# Patient Record
Sex: Male | Born: 1950 | Race: White | Hispanic: No | Marital: Married | Smoking: Former smoker
Health system: Southern US, Community
[De-identification: ages and names within clinical notes are randomized; demographics above are authoritative.]

## PROBLEM LIST (undated history)

## (undated) DIAGNOSIS — I10 Essential (primary) hypertension: Secondary | ICD-10-CM

## (undated) DIAGNOSIS — R112 Nausea with vomiting, unspecified: Secondary | ICD-10-CM

## (undated) DIAGNOSIS — H9190 Unspecified hearing loss, unspecified ear: Secondary | ICD-10-CM

## (undated) DIAGNOSIS — J069 Acute upper respiratory infection, unspecified: Secondary | ICD-10-CM

## (undated) DIAGNOSIS — N318 Other neuromuscular dysfunction of bladder: Secondary | ICD-10-CM

## (undated) DIAGNOSIS — M1990 Unspecified osteoarthritis, unspecified site: Secondary | ICD-10-CM

## (undated) DIAGNOSIS — K219 Gastro-esophageal reflux disease without esophagitis: Secondary | ICD-10-CM

## (undated) DIAGNOSIS — N200 Calculus of kidney: Secondary | ICD-10-CM

## (undated) DIAGNOSIS — Z973 Presence of spectacles and contact lenses: Secondary | ICD-10-CM

## (undated) DIAGNOSIS — R319 Hematuria, unspecified: Secondary | ICD-10-CM

## (undated) HISTORY — PX: PLANTAR FASCIA RELEASE: SHX2239

## (undated) HISTORY — DX: Essential (primary) hypertension: I10

## (undated) HISTORY — PX: TOTAL KNEE ARTHROPLASTY: SHX125

---

## 2002-04-22 ENCOUNTER — Encounter: Payer: Self-pay | Admitting: Family Medicine

## 2002-04-22 ENCOUNTER — Ambulatory Visit (HOSPITAL_COMMUNITY): Admission: RE | Admit: 2002-04-22 | Discharge: 2002-04-22 | Payer: Self-pay | Admitting: Family Medicine

## 2004-01-25 ENCOUNTER — Ambulatory Visit (HOSPITAL_COMMUNITY): Admission: RE | Admit: 2004-01-25 | Discharge: 2004-01-25 | Payer: Self-pay | Admitting: Family Medicine

## 2005-03-15 ENCOUNTER — Ambulatory Visit (HOSPITAL_COMMUNITY): Admission: RE | Admit: 2005-03-15 | Discharge: 2005-03-15 | Payer: Self-pay | Admitting: Family Medicine

## 2005-11-28 ENCOUNTER — Encounter: Admission: RE | Admit: 2005-11-28 | Discharge: 2005-11-28 | Payer: Self-pay | Admitting: Family Medicine

## 2006-05-14 ENCOUNTER — Ambulatory Visit (HOSPITAL_COMMUNITY): Admission: RE | Admit: 2006-05-14 | Discharge: 2006-05-14 | Payer: Self-pay | Admitting: Family Medicine

## 2006-06-14 ENCOUNTER — Ambulatory Visit: Payer: Self-pay | Admitting: Gastroenterology

## 2006-06-26 ENCOUNTER — Ambulatory Visit: Payer: Self-pay | Admitting: Gastroenterology

## 2006-06-26 ENCOUNTER — Encounter (INDEPENDENT_AMBULATORY_CARE_PROVIDER_SITE_OTHER): Payer: Self-pay | Admitting: Specialist

## 2006-06-26 HISTORY — PX: COLONOSCOPY: SHX174

## 2007-02-17 ENCOUNTER — Inpatient Hospital Stay (HOSPITAL_COMMUNITY): Admission: RE | Admit: 2007-02-17 | Discharge: 2007-02-19 | Payer: Self-pay | Admitting: Orthopedic Surgery

## 2007-12-10 ENCOUNTER — Ambulatory Visit (HOSPITAL_COMMUNITY): Admission: RE | Admit: 2007-12-10 | Discharge: 2007-12-10 | Payer: Self-pay | Admitting: Orthopedic Surgery

## 2007-12-30 ENCOUNTER — Inpatient Hospital Stay (HOSPITAL_COMMUNITY): Admission: RE | Admit: 2007-12-30 | Discharge: 2008-01-05 | Payer: Self-pay | Admitting: Orthopedic Surgery

## 2007-12-30 ENCOUNTER — Encounter (INDEPENDENT_AMBULATORY_CARE_PROVIDER_SITE_OTHER): Payer: Self-pay | Admitting: Orthopedic Surgery

## 2008-09-08 ENCOUNTER — Ambulatory Visit (HOSPITAL_BASED_OUTPATIENT_CLINIC_OR_DEPARTMENT_OTHER): Admission: RE | Admit: 2008-09-08 | Discharge: 2008-09-08 | Payer: Self-pay | Admitting: Orthopedic Surgery

## 2008-11-10 ENCOUNTER — Ambulatory Visit (HOSPITAL_COMMUNITY): Admission: RE | Admit: 2008-11-10 | Discharge: 2008-11-10 | Payer: Self-pay | Admitting: Family Medicine

## 2008-11-16 ENCOUNTER — Ambulatory Visit (HOSPITAL_COMMUNITY): Admission: RE | Admit: 2008-11-16 | Discharge: 2008-11-16 | Payer: Self-pay | Admitting: Family Medicine

## 2009-02-04 ENCOUNTER — Encounter (INDEPENDENT_AMBULATORY_CARE_PROVIDER_SITE_OTHER): Payer: Self-pay | Admitting: Orthopedic Surgery

## 2009-02-04 ENCOUNTER — Inpatient Hospital Stay (HOSPITAL_COMMUNITY): Admission: RE | Admit: 2009-02-04 | Discharge: 2009-02-08 | Payer: Self-pay | Admitting: Orthopedic Surgery

## 2009-07-19 ENCOUNTER — Ambulatory Visit (HOSPITAL_COMMUNITY): Admission: RE | Admit: 2009-07-19 | Discharge: 2009-07-19 | Payer: Self-pay | Admitting: Family Medicine

## 2010-06-14 LAB — COMPREHENSIVE METABOLIC PANEL
ALT: 18 U/L (ref 0–53)
AST: 16 U/L (ref 0–37)
Albumin: 3.5 g/dL (ref 3.5–5.2)
Alkaline Phosphatase: 44 U/L (ref 39–117)
BUN: 22 mg/dL (ref 6–23)
CO2: 27 mEq/L (ref 19–32)
Calcium: 8.8 mg/dL (ref 8.4–10.5)
Chloride: 106 mEq/L (ref 96–112)
Creatinine, Ser: 0.88 mg/dL (ref 0.4–1.5)
GFR calc Af Amer: >60 mL/min (ref >60)
GFR calc non Af Amer: >60 mL/min (ref >60)
Glucose, Bld: 137 mg/dL — ABNORMAL HIGH (ref 70–99)
Potassium: 4.2 mEq/L (ref 3.5–5.1)
Sodium: 139 mEq/L (ref 135–145)
Total Bilirubin: 0.9 mg/dL (ref 0.3–1.2)
Total Protein: 6.1 g/dL (ref 6.0–8.3)

## 2010-06-14 LAB — WOUND CULTURE
Culture: NO GROWTH
Gram Stain: NONE SEEN

## 2010-06-14 LAB — CBC
HCT: 31.8 % — ABNORMAL LOW (ref 39.0–52.0)
HCT: 32.3 % — ABNORMAL LOW (ref 39.0–52.0)
HCT: 34.2 % — ABNORMAL LOW (ref 39.0–52.0)
HCT: 35 % — ABNORMAL LOW (ref 39.0–52.0)
Hemoglobin: 11.1 g/dL — ABNORMAL LOW (ref 13.0–17.0)
Hemoglobin: 11.2 g/dL — ABNORMAL LOW (ref 13.0–17.0)
Hemoglobin: 11.7 g/dL — ABNORMAL LOW (ref 13.0–17.0)
Hemoglobin: 12 g/dL — ABNORMAL LOW (ref 13.0–17.0)
MCHC: 34.3 g/dL (ref 30.0–36.0)
MCHC: 34.4 g/dL (ref 30.0–36.0)
MCHC: 34.7 g/dL (ref 30.0–36.0)
MCHC: 35 g/dL (ref 30.0–36.0)
MCV: 100.4 fL — ABNORMAL HIGH (ref 78.0–100.0)
MCV: 100.4 fL — ABNORMAL HIGH (ref 78.0–100.0)
MCV: 100.9 fL — ABNORMAL HIGH (ref 78.0–100.0)
MCV: 101.6 fL — ABNORMAL HIGH (ref 78.0–100.0)
Platelets: 190 10*3/uL (ref 150–400)
Platelets: 192 10*3/uL (ref 150–400)
Platelets: 204 10*3/uL (ref 150–400)
Platelets: 236 10*3/uL (ref 150–400)
RBC: 3.17 MIL/uL — ABNORMAL LOW (ref 4.22–5.81)
RBC: 3.22 MIL/uL — ABNORMAL LOW (ref 4.22–5.81)
RBC: 3.37 MIL/uL — ABNORMAL LOW (ref 4.22–5.81)
RBC: 3.47 MIL/uL — ABNORMAL LOW (ref 4.22–5.81)
RDW: 11.8 % (ref 11.5–15.5)
RDW: 11.9 % (ref 11.5–15.5)
RDW: 11.9 % (ref 11.5–15.5)
RDW: 12.2 % (ref 11.5–15.5)
WBC: 4 10*3/uL (ref 4.0–10.5)
WBC: 4.8 10*3/uL (ref 4.0–10.5)
WBC: 5.2 10*3/uL (ref 4.0–10.5)
WBC: 5.4 10*3/uL (ref 4.0–10.5)

## 2010-06-14 LAB — BASIC METABOLIC PANEL
BUN: 8 mg/dL (ref 6–23)
BUN: 8 mg/dL (ref 6–23)
CO2: 31 mEq/L (ref 19–32)
CO2: 32 mEq/L (ref 19–32)
Calcium: 8.5 mg/dL (ref 8.4–10.5)
Calcium: 8.7 mg/dL (ref 8.4–10.5)
Chloride: 100 mEq/L (ref 96–112)
Chloride: 102 mEq/L (ref 96–112)
Creatinine, Ser: 0.9 mg/dL (ref 0.4–1.5)
Creatinine, Ser: 1.13 mg/dL (ref 0.4–1.5)
GFR calc Af Amer: >60 mL/min (ref >60)
GFR calc Af Amer: >60 mL/min (ref >60)
GFR calc non Af Amer: >60 mL/min (ref >60)
GFR calc non Af Amer: >60 mL/min (ref >60)
Glucose, Bld: 115 mg/dL — ABNORMAL HIGH (ref 70–99)
Glucose, Bld: 96 mg/dL (ref 70–99)
Potassium: 4.2 mEq/L (ref 3.5–5.1)
Potassium: 4.4 mEq/L (ref 3.5–5.1)
Sodium: 136 mEq/L (ref 135–145)
Sodium: 138 mEq/L (ref 135–145)

## 2010-06-14 LAB — GRAM STAIN: Gram Stain: NONE SEEN

## 2010-06-14 LAB — PROTIME-INR
INR: 1.09 (ref 0.00–1.49)
INR: 1.17 (ref 0.00–1.49)
INR: 1.41 (ref 0.00–1.49)
INR: 1.48 (ref 0.00–1.49)
Prothrombin Time: 14 seconds (ref 11.6–15.2)
Prothrombin Time: 14.8 seconds (ref 11.6–15.2)
Prothrombin Time: 17.1 seconds — ABNORMAL HIGH (ref 11.6–15.2)
Prothrombin Time: 17.8 seconds — ABNORMAL HIGH (ref 11.6–15.2)
Prothrombin Time: 19.8 seconds — ABNORMAL HIGH (ref 11.6–15.2)

## 2010-06-14 LAB — URINALYSIS, ROUTINE W REFLEX MICROSCOPIC
Bilirubin Urine: NEGATIVE
Glucose, UA: NEGATIVE mg/dL
Hgb urine dipstick: NEGATIVE
Ketones, ur: NEGATIVE mg/dL
Nitrite: NEGATIVE
Protein, ur: NEGATIVE mg/dL
Specific Gravity, Urine: 1.023 (ref 1.005–1.030)
Urobilinogen, UA: 0.2 mg/dL (ref 0.0–1.0)
pH: 6 (ref 5.0–8.0)

## 2010-06-14 LAB — TYPE AND SCREEN
ABO/RH(D): A POS
Antibody Screen: NEGATIVE

## 2010-06-14 LAB — APTT: aPTT: 35 seconds (ref 24–37)

## 2010-06-16 LAB — CREATININE, SERUM: GFR calc non Af Amer: >60 mL/min (ref >60)

## 2010-06-19 LAB — POCT I-STAT 4, (NA,K, GLUC, HGB,HCT)
Glucose, Bld: 94 mg/dL (ref 70–99)
HCT: 42 % (ref 39.0–52.0)
Hemoglobin: 14.3 g/dL (ref 13.0–17.0)
Sodium: 138 mEq/L (ref 135–145)

## 2010-07-25 NOTE — H&P (Signed)
Gerald Harding, Gerald Harding               ACCOUNT NO.:  [id number]   MEDICAL RECORD NO.:  [id number]          PATIENT TYPE:  INP   LOCATION:  NA                           FACILITY:  [facility]   PHYSICIAN:  Ollen Gross, M.D.    DATE OF BIRTH:  Jun 05, 1950   DATE OF ADMISSION:  12/30/2007  DATE OF DISCHARGE:                              HISTORY & PHYSICAL   CHIEF COMPLAINT:  Painful right total knee arthroplasty.   Gerald Harding is a 60 year old male with right knee pain and a right total  knee arthroplasty.  He has had it evaluated with x-rays, aspiration, and  a bone scan and findings are consistent with loosening with either  synovitis or infection.  The patient has elected to proceed with a  reevaluation and possible revision of his right total knee arthroplasty  by Dr. Lequita Halt on December 30, 2007.   PRIMARY CARE PHYSICIAN:  Dr. Regino Schultze in [place].   ALLERGIES:  No known drug allergies.   CURRENT MEDICATIONS:  Arthrotec, Amitiza, Diovan, multivitamins,  Prilosec OTC, and Vicodin.   PAST MEDICAL HISTORY:  1. Asthma as a child.  2. Hypertension.  3. Reflux.  4. Occasional constipation.  5. Stress test 2 years previous, reported normal.   REVIEW OF SYSTEMS:  Negative for any neurologic issues.  PULMONARY:  Issues are unremarkable.  Last asthma attack was as a young child.  CARDIOVASCULAR:  He had a stress test 2 years previous.  He denies any  chest pain, shortness of breath, irregular heart rhythms or any other  cardiac issues.  GI is positive for reflux which is well-controlled with  over-the-counter Prilosec.  He denies any other issues, GU is  unremarkable.  Hematologic and endocrine are unremarkable.   PAST SURGICAL HISTORY:  Includes a right knee arthroscopy, right total  knee arthroplasty in December 2008, and a fascial release on his heel  without any complications with anesthesia.   FAMILY MEDICAL HISTORY:  Father is deceased from lung cancer.  Mother is  alive and well,  R.N. over at [employer].   SOCIAL HISTORY:  The patient is married.  He owns [employer].  He  is currently working.  He denies any smoking.  Occasional alcoholic  beverage.  He lives in a [place] home.   PHYSICAL EXAM:  VITALS:  Height is 5 feet 11 inches, weight is 195  pounds, blood pressure is 138/82, pulse of 70 and regular, respirations  12, patient is afebrile.  GENERAL:  This is a healthy-appearing gentleman, conscious, alert and  appropriate.  Walks very easy balanced gait.  HEENT:  Head was normocephalic.  Pupils equal, round, reactive.  Gross  hearing is intact.  NECK:  Supple.  Good range of motion.  CHEST:  Lung sounds were clear and equal bilaterally.  No wheezes,  rales, rhonchi.  HEART:  Regular rate and rhythm.  ABDOMEN:  Soft.  Bowel sounds present.  EXTREMITIES:  Upper extremities had good range of motion of shoulders,  elbows, wrists.  Good motor strength.  Lower Extremities:  Both hips had  full extension and flexion up to 130,  30 degrees internal-external  rotation without any discomfort.  Right knee had a well-healed midline  surgical incision.  He had a little soft tissue swelling.  He was sore  along the femoral condyle region.  He was able to fully extend it.  He  can flex it back to 130 degrees.  He had no gross instability.  Calf was  soft, nontender.  Left knee had full extension.  He was able to flex it  back 120.  He had no gross instability.  Both ankles had good range of  motion.  PERIPHERAL VASCULAR:  Carotid pulses were 2+ bruits.  Radial pulses 2+  and posterior tibial pulses were 1+.  He had no pigmentation changes in  the lower extremities.  NEURO:  The patient was conscious, alert, appropriate.  No gross  neurologic changes.  Breast, rectal and GU exams were deferred at this time.   IMPRESSION:  1. Right total knee arthroplasty 1 year previous with possible      loosening versus synovitis versus infection.  2. Hypertension.  3. Reflux  disease.  4. Occasional constipation.  5. Childhood asthma.  6. Stress test 2 years previous, reported to be normal.   PLAN:  The patient will undergo all routine labs and tests prior to  having a reevaluation and possible revision of his right total knee  arthroplasty by Dr. Lequita Halt at [facility] on December 30, 2007.      Jamelle Rushing, P.A.      Ollen Gross, M.D.  Electronically Signed    RWK/MEDQ  D:  12/29/2007  T:  12/29/2007  Job:  [id number]

## 2010-07-25 NOTE — Op Note (Signed)
NAMEKEMARION, ABBEY               ACCOUNT NO.:  [id number]   MEDICAL RECORD NO.:  [id number]          PATIENT TYPE:  INP   LOCATION:  2550                         FACILITY:  [facility]   PHYSICIAN:  Robert A. Thurston Hole, M.D. DATE OF BIRTH:  08/06/1950   DATE OF PROCEDURE:  02/17/2007  DATE OF DISCHARGE:                               OPERATIVE REPORT   PREOPERATIVE DIAGNOSIS:  Right knee degenerative joint disease.   POSTOPERATIVE DIAGNOSIS:  Right knee degenerative joint disease.   PROCEDURE:  Right total knee replacement using DePuy cemented total knee  system with #3 cemented femur, #4 cemented tibia, with 10-mm  polyethylene RP tibial spacer and 32-mm polyethylene cemented patella.   SURGEON:  Elana Alm. Thurston Hole, M.D.   ASSISTANT:  Julien Girt, P.A.   ANESTHESIA:  General.   OPERATIVE TIME:  1 hour 20 minutes.   COMPLICATIONS:  None.   DESCRIPTION OF PROCEDURE:  Mr. Blossom was brought to the operating room  on February 17, 2007 after a femoral nerve block was placed in the  holding room by anesthesia.  He was placed on the operating table in the  supine position none.  He received Ancef 1 gram IV preoperatively for  prophylaxis.  After being placed under general anesthesia, he had a  Foley catheter placed under sterile conditions.   His right knee was examined under anesthesia.  Range of motion 0-125  degrees with significant patellofemoral crepitation.  Overall alignment  is satisfactory.  The right leg was prepped using sterile DuraPrep and  draped using sterile technique.  The leg was exsanguinated and a thigh  tourniquet elevated to 365 mm.   Initially through a 12-cm longitudinal incision based over the patella,  initial exposure was made.  The underlying subcutaneous tissues were  incised along with the skin incision.  A median arthrotomy was performed  revealing an excessive amount of normal-appearing joint fluid.  The  articular surfaces were inspected.  He had  grade 3 changes medially and  laterally and grade 4 changes in the patellofemoral joint.  Osteophytes  were removed from the femoral condyles and tibial plateau.  The medial  and lateral meniscal remnants were removed as well as the anterior  cruciate ligament.  An intramedullary drill was then drilled up the  femoral canal for placement of the distal femoral cutting jig which was  placed in the appropriate amount of rotation, and a distal 11-mm cut was  made.  The distal femur was incised.  A #3 was found be the appropriate  size.  The #3 cutting jig was placed in the appropriate amount of  external rotation, and these cuts were made.  The proximal tibia was  then exposed.  The tibial spines were removed with an oscillating saw.  Intramedullary drill was drilled down the tibial canal for placement of  the proximal tibial cutting jig which was placed in the appropriate  amount of rotation, and a 6-mm proximal cut was made.  Spacer blocks  were then placed in flexion and extension.  The 10-mm blocks gave  excellent balancing, excellent stability, and overall alignment  excellent.  At this point, the #4 tibial base plate trial was placed on  the cut tibial surface, and this gave an excellent fit and a keel cut  was made.  The PCL #3 box cutter was then placed on the distal femur,  and these cuts were made.  At this point, the #3 femoral trial was  placed, #4 tibial base plate trial with a 10-mm polyethylene RP tibial  spacer.  The knee was reduced and taken through a range of motion from 0-  125 degrees, with excellent stability and overall alignment  satisfactory.  At this point, the patella was sized.  A resurfacing 8-mm  cut was made and 3 locking holes placed for a 32-mm patella.  The  patellar trial was placed.  Patellofemoral tracking was evaluated and  found to be normal.  At this point, it was felt that all of the trial  components were of excellent size, fit, and stability.  They  were then  removed.  The knee was then jet lavage irrigated with 3 liters of  saline.  The proximal tibia was then exposed.  A #4 tibial baseplate  with cement backing was hammered into position with an excellent fit,  with excess cement being removed from around the edges.  A #3 femoral  component with cement backing was hammered into position also with an  excellent fit, with excess cement being removed from around the edges.  The 10-mm polyethylene RP tibial spacer was placed on the tibial  baseplate and the knee reduced, taken through a range of motion from 0-  125 degrees, with excellent stability and excellent overall alignment.  The #32-mm polyethylene cemented patella was then placed in its position  and held there with a clamp.  After the cement hardened, patellofemoral  tracking was again evaluated and found to be normal.  At this point, it  was felt that all of the components were of excellent size, fit, and  stability.  The wound was further irrigated.  The tourniquet was  released.  Hemostasis was obtained with cautery.  The arthrotomy was  then closed with #1 Ethibond suture over 2 medium Hemovac drains.  The  subcutaneous tissues were closed with 0 and 2-0 Vicryl and the  subcuticular layer closed with 4-0 Monocryl.  Sterile dressings and a  long-leg splint applied.   The patient was awakened and taken to the recovery room in a stable  condition.  Needle, sponge, and instrument counts were correct x2 at the  end of the case.      Robert A. Thurston Hole, M.D.  Electronically Signed     RAW/MEDQ  D:  02/17/2007  T:  02/17/2007  Job:  [id number]

## 2010-07-25 NOTE — Op Note (Signed)
Gerald Harding, Gerald Harding               ACCOUNT NO.:  [id number]   MEDICAL RECORD NO.:  [id number]          PATIENT TYPE:  INP   LOCATION:  1604                         FACILITY:  [facility]   PHYSICIAN:  Ollen Gross, M.D.    DATE OF BIRTH:  1950-06-17   DATE OF PROCEDURE:  12/30/2007  DATE OF DISCHARGE:                               OPERATIVE REPORT   PREOPERATIVE DIAGNOSIS:  Failed right total knee arthroplasty.   POSTOPERATIVE DIAGNOSIS:  Failed right total knee arthroplasty.   PROCEDURE:  Right total knee arthroplasty revision.   SURGEON:  Ollen Gross, M.D.   ASSISTANT:  Alexzandrew L. Julien Girt, P.A.C.  and Dr. Thurston Hole.   ANESTHESIA:  General, postop Marcaine pain pump.   ESTIMATED BLOOD LOSS:  300.   DRAINS:  Hemovac times one.   TOURNIQUET TIME:  Up 57 minutes at 300 mmHg, down 8 minutes and up an  additional 37 minutes at 300 mmHg.   COMPLICATIONS:  None.   CONDITION:  Stable to recovery.   CLINICAL NOTE:  Gerald Harding is a 60 year old male with severe right knee  pain.  He had an uncomplicated right total knee arthroplasty done almost  a year ago.  He has had recurrent effusions as well as marked pain in  the knee.  He has had a few aspirations which showed no signs of  infection.  Given his marked pain and recurrent effusions we obtained a  three phase bone scan which showed increased activity on each phase  consistent with either loosening, significant synovitis or infection.  He presents now for right total knee arthroplasty revision versus  resection arthroplasty.   PROCEDURE IN DETAIL:  After the successful administration of general  anesthetic a tourniquet was placed high on the right thigh and right  lower extremity prepped and draped in the usual sterile fashion.  Extremity was wrapped in Esmarch, knee flexed, tourniquet inflated to  300 mmHg.  Midline incision made with a 10 blade through subcutaneous  tissue to the level of the extensor mechanism.  A fresh  blade was used  to make a medial parapatellar arthrotomy.  There is a fair amount of  clear fluid encountered and that was sent for stat Gram stain which was  negative.  The soft tissue of the proximal medial tibia was then  subperiosteally elevated with the knife into the semimembranosus bursa  with a Cobb elevator.  Soft tissue laterally was elevated with attention  being paid to avoid the patellar tendon on tibial tubercle.  Patella was  everted and knee flexed 90 degrees.  We inspected the joint, did not see  any damage to the polyethylene.  I sent a specimen of synovium and that  was shown to have no evidence of any acute increased inflammation.  We  then subsequently sent two more specimens from other areas of the knee,  also which did not show any acute inflammation.   We removed the tibial polyethylene from the tibial tray.  The posterior  tissue did not have any evidence of significant inflammation or  irritation.  We then removed the femoral component  by disrupting the  interface between the metal and the femur with osteotomes.  It was not  grossly loose but came off with little effort.  There was minimal bone  loss on the component.  On the tibial side we subsequently subluxed the  tibia forward and I used an oscillating saw to disrupt the interface  between the tibial component and bone.  Tibial component was also  removed with minimal bone loss.   The cut bone surfaces were then inspected and any cement was removed.  The cement from the tibial canal was also removed.  Both canals were  thoroughly irrigated with saline solution and on the tibial side we  reamed up to 13 mm and on the femoral side reamed up to 18 mm.   We then used the extramedullary cutting guide on the tibia to perform  the tibial resection.  We referenced proximally at the medial aspect of  the tibial tubercle and distally along the second metatarsal axis of  tibial crest.  I removed about a mm or 2 from  the cut bone surface of  the tibia.  Size 4 was the most appropriate tibial component and the  proximal tibia was prepared with the modular drill and a keel punch for  the size 4.  On the femoral side I placed the 18 mm reamer and used that  as our intramedullary guide.  The 5 degree right valgus alignment guide  is placed and I took 4 mm off the distal femur and replaced with 4 mm  augments medially and laterally.  Size 4 is the most appropriate femoral  component.  The AP block is placed in a +2 position, rotation is  determined by placing a 12.5 mm spacer at 90 degrees of flexion to get a  rectangular flexion gap.  The anterior and posterior cuts are made.  I  went to a +4 position posteriorly for the 4 mm posterior augment.  The  intercondylar chamfer blocks were placed and those cuts were  subsequently made.   The trial MBT revision tibia size 4 with a 13 x 30 stem extension is  placed and then on the femoral side the TC3 size 4 femur with an 18 x 75  stem extension and 4 mm medial lateral posterior and distal augments.  The components had excellent fit on the cut bony surfaces.  A 12.5 mm  insert was placed which had outstanding stability with full extension  and excellent stability of varus-valgus anterior-posterior stressing all  the way through the full range of motion.  The patella was in good  condition and we left the patella intact.  I then performed a thorough  synovectomy throughout the entire joint.  We subsequently let the  tourniquet down for an initial tourniquet time of 57 minutes.  The joint  was inspected and mild bleeding was stopped with electrocautery.  The  tourniquet was held down for 8 minutes while the components were  assembled on the back table.   Once the components were assembled the tourniquet was down for 8 minutes  and rewrapped the leg in Esmarch and inflated again to 300 mmHg.  The  trials were removed and cement restrictor, a trial was placed and  size 4  most appropriate.  Size 4 cement restrictor is placed at the appropriate  depth in the tibial canal.  We then thoroughly prepared the bone with  pulsatile lavage.  The cement was mixed.  Once ready for implantation  it  is injected into the tibial canal and then the tibial component was  impacted and extruded cement removed.  On the femoral side we Press-Fit  the stem and cemented distally.  All extruded cement was removed.  Please note that 3 grams of vancomycin were mixed with the 3 batches of  cement.  The trial 12.5 mm insert was placed and knee held in full  extension and all extruded cement removed.  The cement was fully  hardened and the permanent 12.5 mm TC3 rotating platform insert is  placed into the tibial tray.  This gave [place] outstanding stability  throughout full range of motion.  Wound was copiously irrigated with  saline solution and the arthrotomy closed over Hemovac drain with  interrupted #1 PDS.  Flexion against gravity was 135 degrees.  Subcu was  closed with interrupted 2-0 Vicryl subcu  and skin closed with staples.  The catheter for the Marcaine pain pump  is placed and the pump was initiated.  A bulky sterile dressing was  applied and he was placed into a knee immobilizer, awakened and  transported to recovery in stable condition.      Ollen Gross, M.D.  Electronically Signed     FA/MEDQ  D:  12/30/2007  T:  12/31/2007  Job:  [id number]

## 2010-07-25 NOTE — Discharge Summary (Signed)
Gerald Harding, Gerald Harding               ACCOUNT NO.:  [id number]   MEDICAL RECORD NO.:  [id number]          PATIENT TYPE:  INP   LOCATION:  1604                         FACILITY:  [facility]   PHYSICIAN:  Ollen Gross, M.D.    DATE OF BIRTH:  06/29/1950   DATE OF ADMISSION:  12/30/2007  DATE OF DISCHARGE:  01/05/2008                               DISCHARGE SUMMARY   ADDENDUM DISCHARGE SUMMARY   ADMISSION DIAGNOSES:  Same.   DISCHARGE DIAGNOSES:  Same.   PROCEDURE:  Same.   ADDITION TO LABORATORY DATA:  Serial pro times were followed.  The last-  noted INR was 114 at the time of the original dictation.  INR was 1.5  prior to discharge, slowly titrating up.   HOSPITAL COURSE:  The patient was originally scheduled to possibly leave  the hospital on Friday, 10/23.  We were looking for a short-term rehab  bed.  Fortunately, no beds were available, so he remained in the  hospital through the weekend.  He did extremely well with therapy,  walking about 200 feet.  The incision continued to heal well.  No signs  of infection.  Progressed very well, so home arrangements were made.   He was seen on rounds on the morning of October 26 with no complaints,  tolerating his meds, ambulating well, and was discharged home that day.   DISCHARGE PLAN:  1. Discharged home on January 05, 2008.  2. Discharge diagnoses:  Please see above.  3. Discharge meds:  Percocet, Robaxin, Coumadin, and Lovenox for 2      more days at home.  4. Diet:  Heart-healthy diet.  5. Activity:  Weightbearing as tolerated to the right lower extremity.      Home health PT, home health nursing, total knee protocol.  Gait-      training ambulation, ADLs.  6. Followup:  He is going to follow up on Friday, January 09, 2008,      with Dr. Despina Hick.  Please contact the office for an appointment at      [phone].   DISPOSITION:  Home.   CONDITION ON DISCHARGE:  Improving.      Gerald Harding, P.A.C.      Ollen Gross, M.D.  Electronically Signed    ALP/MEDQ  D:  01/05/2008  T:  01/05/2008  Job:  [id number]   cc:   Kirk Ruths, M.D.  Fax: [phone]   Elana Alm. Thurston Hole, M.D.  Fax: [phone]

## 2010-07-25 NOTE — Discharge Summary (Signed)
Gerald Harding, Gerald Harding               ACCOUNT NO.:  [id number]   MEDICAL RECORD NO.:  [id number]          PATIENT TYPE:  INP   LOCATION:  1604                         FACILITY:  [facility]   PHYSICIAN:  Ollen Gross, M.D.    DATE OF BIRTH:  [birth year]   DATE OF ADMISSION:  12/30/2007  DATE OF DISCHARGE:                               DISCHARGE SUMMARY   PRIMARY CARE PHYSICIAN:  Kirk Ruths, M.D.   ADMISSION DIAGNOSES:  1. Right knee pain, possible loosening, possible synovitis, possible      infection, status post right total knee.  2. Hypertension.  3. Reflux disease.  4. Occasional constipation.  5. Childhood asthma.   DISCHARGE DIAGNOSES:  1. Failed right total knee arthroplasty, status post right total knee      arthroplasty revision.  2. Mild postoperative blood loss anemia.  3. Mild postoperative hyponatremia.  4. Hypertension.  5. Reflux disease.  6. Occasional constipation.  7. Childhood asthma.   PROCEDURE:  December 30, 2007:  Right total knee arthroplasty revision.  Surgeon:  Ollen Gross, M.D., Assistant:  Elana Alm. Thurston Hole, M.D.,  Second assistant:  Avel Peace, PA-C.  Anesthesia:  General with a  postoperative Marcaine pain pump.   CONSULTATIONS:  None.   BRIEF HISTORY:  Gerald Harding is a 60 year old male with severe right knee  pain, an uncomplicated right total knee arthroplasty about one year ago,  although he started having recurrent effusions postoperatively with  increasing pain. He has had multiple work-ups, a few aspirations with no  signs of infection, although a three phase bone scan did show increased  activity in each phase consistent with possible loosening, synovitis or  infection.  Subsequently he was admitted to the hospital for revision  arthroplasty.   LABORATORY DATA:  CBC preoperative hemoglobin 13.2, hematocrit 38.2,  white cell count 5.0, platelet count 223,000.  Chem panel all within  normal limits.  PT/INR preoperative 13.5/1.0 with  PTT of 29.  Preoperative UA was negative.  Serial CBC's were followed and hemoglobin  dropped to 10.1 and then 9.6 with last noted H/H at time of dictation  9.8 and 27.8, stable.  Serial Pro Times were followed and last noted  PT/INR 18.1 and 1.4.  BMET's postoperatively:  Sodium did drop down to  135, from a preoperative level of 138, stabilized at 134.  Remaining  electrolytes remained within normal limits.   Pathology report, final diagnosis:  Synovium right knee, fibroconnective  tissue, stroma and fibrinous exudate.  No increase in neutrophils  identified.  There were three specimens sent to pathology.  First one:  No increase in neutrophils.  Second one:  No increase in neutrophils.  Third one:  No increase in neutrophils.   X-rays, two view chest, December 29, 2007:  No active disease.   EKG February 12, 2007:  Normal sinus rhythm  Normal EKG.  No previous  traces.  Confirmed by Dr. Golden Bing.   HOSPITAL COURSE:  The patient was admitted to [facility],  tolerated the procedure well and later was transferred to the recovery  room and then the  orthopedic floor.  Patient was started on PCA and p.o.  analgesics for pain control following surgery.  Patient was started on  Coumadin protocol for DVT prophylaxis.  Was given 24 hours postoperative  IV antibiotics.  Did pretty well on the evening of surgery and into the  morning of day #1.  Was weaning over off of his PCA over to p.o.  medications.  On the morning rounds of day #1 the patient states he can  already tell a difference, sitting up in the chair doing extremely well.  He still had a little bit of drainage from the Hemovac drain which was  placed at the time of surgery and so we left that in on morning rounds.  Blood pressure was a little low and continued on fluids.  Patient was  started back on his Prilosec for his reflux.  He started getting up out  of bed on day #1.  By day #2 he was doing extremely well with  very  little swelling when we changed the dressing.  The incision looked  excellent.  His hemoglobin was 9.6 and he was asymptomatic with this.  Blood pressure was stable.  He had cultures and STAT Gram stains taken  at the time of surgery.  The STAT Gram stains showed no organisms, no  WBC's and the wound cultures, anaerobic cultures were preliminary but  showing no growth at the time of dictation.  He had been weaned over to  p.o. medications, we discontinued the PCA and the IV fluids and  continued to ambulate well.  It was felt that the patient would require  a short stay in a rehab facility.  We got discharge planning involved.  He was seen on morning rounds on postoperative day #3.  He was doing  extremely well.  Unfortunately, the opposite leg/opposite knee started  flaring up.  The patient was concerned that he had a gout flare in this  knee.  Apparently the same thing happened to him about a year ago when  he was in the hospital.  About three to four days postoperative he had  another flare of the knee.   PROCEDURE:  Bedside procedure performed by Dr. Lequita Halt.  The patient  underwent local aspiration of about 50 mL of normal appearing synovial  fluid followed by a cortisone injection of Depo-Medrol and Lidocaine.  The patient tolerated this well.   He continued with therapy.  There was a possibility that a bed at a  skilled rehab facility would be available later that day and  arrangements were being made and if everything was lined up, he would be  able to be discharged on January 02, 2008.   DISCHARGE PLAN:  Tentative discharge today, January 02, 2008.   DISCHARGE DIAGNOSES:  Please see above.   DISCHARGE MEDICATIONS:  1. He is on Coumadin protocol.  Please titrate the Coumadin level for      a target INR between 2.0 and 3.0.  He will need to be on Coumadin      for three weeks from the date of surgery of December 30, 2007.  2. Colace 100 mg p.o. b.i.d.  3. Diovan 160 mg  p.o. q.a.m., hold for systolic pressure less than      130.  4. Amitiza 24 mcg capsules p.o. daily.  5. Prilosec 20 mg p.o. daily.  6. Nu-Iron 150 mg p.o. daily x3 weeks and then discontinue the iron.  7. Percocet 5 mg one or two every  4 hours as needed for pain.  8. Tylenol 325 mg one or two every 4 to 6 hours as needed for mild      pain, temperature or headache.  9. Robaxin 500 mg p.o. q.6h. p.r.n. spasm.  10.Ambien 10 mg p.o. nightly p.r.n. sleep.   DIET:  Heart healthy diet.   ACTIVITY:  He is weight-bearing as tolerated to the right lower  extremity, total knee protocol.  Physical therapy and occupational  therapy for gait training, ambulation, activities of daily living, range  of motion exercises.   WOUND CARE:  Daily dressing changes.  He may start showering, however,  do not submerge the incision under water.   FOLLOWUP:  He needs to follow up with Dr. Lequita Halt in the office in  approximately two weeks from the date of surgery.  Please contact the  office at [phone] to help arrange appointment for followup of this  patient.   DISPOSITION:  Pending at the time of this dictation.  Awaiting final bed  offers.   CONDITION ON DISCHARGE:  Pending, will discharge if bed is available and  improved.      Alexzandrew L. Perkins, P.A.C.      Ollen Gross, M.D.  Electronically Signed    ALP/MEDQ  D:  01/02/2008  T:  01/02/2008  Job:  [id number]   cc:   Kirk Ruths, M.D.  Fax: [phone]   Elana Alm. Thurston Hole, M.D.  Fax: [phone]

## 2010-07-25 NOTE — Op Note (Signed)
NAMEBENFORD, Gerald Harding               ACCOUNT NO.:  [id number]   MEDICAL RECORD NO.:  [id number]          PATIENT TYPE:  AMB   LOCATION:  [facility]                         FACILITY:  [facility]   PHYSICIAN:  Ollen Gross, M.D.    DATE OF BIRTH:  05-10-1950   DATE OF PROCEDURE:  09/08/2008  DATE OF DISCHARGE:                               OPERATIVE REPORT   PREOPERATIVE DIAGNOSIS:  Hypertrophic synovitis, right knee.   POSTOPERATIVE DIAGNOSIS:  Hypertrophic synovitis, right knee.   PROCEDURE:  Right knee arthroscopy with synovectomy.   SURGEON:  Ollen Gross, M.D., no assist.   ANESTHESIA:  General.   ESTIMATED BLOOD LOSS:  Minimal.   DRAINS:  None.   COMPLICATIONS:  None.   CONDITION:  Stable to recovery.   BRIEF CLINICAL NOTE:  Gerald Harding is a 60 year old male with long complex  history in regards to his right knee.  He has had some recurrent  swelling post revision total knee arthroplasty.  Infection workup has  been negative.  He has always responded to prednisone.  It is felt that  he has hypertrophic synovitis, and he presents now for arthroscopy and  debridement.   PROCEDURE IN DETAIL:  After successful administration of general  anesthetic, tourniquet was placed high on his right thigh, and his right  lower extremity was prepped and draped in usual sterile fashion.  Standard superomedial and inferolateral incision was made.  Inflow  cannula passed superomedial.  Camera passed inferolateral.  Arthroscopic  visualization proceeds.  It is noted that he has some hypertrophic  synovium present superiorly and laterally.  I created a superolateral  portal with a knife and then placed the ArthroCare device.  The  ArthroCare was used to sequentially debride all of the hypertrophic and  inflamed synovium starting in the suprapatellar area and coursing along  the medial gutter and finishing in the lateral gutter.  There was more  inflamed tissue in the lateral gutter than the medial.  I also  used a  shaver to remove any of the larger pieces.  We then completed hemostasis  with the ArthroCare device.  I was satisfied that all of the inflamed  tissue had been removed.  The components looked fine at their interfaces  between metal and bone.  We then removed the arthroscopic equipment from  the inferior portal which was closed with interrupted 4-0 nylon.  Twenty  mL of 0.25% Marcaine  with epinephrine injected through the inflow cannula, then that was  removed, and that portal closed with nylon.  Superolateral portal was  also closed with nylon.  The incisions were then cleaned and dried, and  a bulky sterile dressing was applied.  He was then awakened and  transported to recovery in stable condition.      Ollen Gross, M.D.  Electronically Signed     FA/MEDQ  D:  09/08/2008  T:  09/08/2008  Job:  [id number]

## 2010-10-25 ENCOUNTER — Emergency Department (HOSPITAL_COMMUNITY): Payer: BC Managed Care – PPO

## 2010-10-25 ENCOUNTER — Emergency Department (HOSPITAL_COMMUNITY)
Admission: EM | Admit: 2010-10-25 | Discharge: 2010-10-25 | Disposition: A | Discharge status: Home / Self Care | Payer: BC Managed Care – PPO | Attending: Emergency Medicine | Admitting: Emergency Medicine

## 2010-10-25 DIAGNOSIS — I10 Essential (primary) hypertension: Secondary | ICD-10-CM | POA: Insufficient documentation

## 2010-10-25 DIAGNOSIS — H9209 Otalgia, unspecified ear: Secondary | ICD-10-CM | POA: Insufficient documentation

## 2010-10-25 DIAGNOSIS — G510 Bell's palsy: Secondary | ICD-10-CM | POA: Insufficient documentation

## 2010-10-25 DIAGNOSIS — R29810 Facial weakness: Secondary | ICD-10-CM | POA: Insufficient documentation

## 2010-10-25 DIAGNOSIS — R209 Unspecified disturbances of skin sensation: Secondary | ICD-10-CM | POA: Insufficient documentation

## 2010-10-25 LAB — POCT I-STAT TROPONIN I: Troponin i, poc: 0 ng/mL (ref 0.00–0.08)

## 2010-10-25 LAB — DIFFERENTIAL
Basophils Absolute: 0 10*3/uL (ref 0.0–0.1)
Basophils Relative: 0 % (ref 0–1)
Eosinophils Relative: 0 % (ref 0–5)
Monocytes Absolute: 0.5 10*3/uL (ref 0.1–1.0)
Neutro Abs: 5.7 10*3/uL (ref 1.7–7.7)

## 2010-10-25 LAB — COMPREHENSIVE METABOLIC PANEL
AST: 13 U/L (ref 0–37)
Albumin: 4 g/dL (ref 3.5–5.2)
Calcium: 9.6 mg/dL (ref 8.4–10.5)
Creatinine, Ser: 0.91 mg/dL (ref 0.50–1.35)
Total Protein: 7.5 g/dL (ref 6.0–8.3)

## 2010-10-25 LAB — CBC
MCHC: 35.1 g/dL (ref 30.0–36.0)
RDW: 12.1 % (ref 11.5–15.5)

## 2010-12-12 LAB — ANAEROBIC CULTURE: Gram Stain: NONE SEEN

## 2010-12-12 LAB — CBC
HCT: 27.7 — ABNORMAL LOW
HCT: 27.8 — ABNORMAL LOW
Hemoglobin: 10.1 — ABNORMAL LOW
Hemoglobin: 13.2
Hemoglobin: 9.6 — ABNORMAL LOW
Hemoglobin: 9.8 — ABNORMAL LOW
MCHC: 34.6
MCHC: 34.8
MCHC: 35.2
MCV: 101.2 — ABNORMAL HIGH
MCV: 101.8 — ABNORMAL HIGH
MCV: 101.8 — ABNORMAL HIGH
Platelets: 148 — ABNORMAL LOW
Platelets: 163
RBC: 2.73 — ABNORMAL LOW
RBC: 2.74 — ABNORMAL LOW
RBC: 2.85 — ABNORMAL LOW
RBC: 3.75 — ABNORMAL LOW
RDW: 11.9
RDW: 12.2
WBC: 5.1
WBC: 6

## 2010-12-12 LAB — URINALYSIS, ROUTINE W REFLEX MICROSCOPIC
Glucose, UA: NEGATIVE
Hgb urine dipstick: NEGATIVE
Protein, ur: NEGATIVE
Specific Gravity, Urine: 1.027
pH: 6

## 2010-12-12 LAB — COMPREHENSIVE METABOLIC PANEL
ALT: 24
CO2: 27
Calcium: 9
Creatinine, Ser: 0.91
GFR calc non Af Amer: >60
Glucose, Bld: 100 — ABNORMAL HIGH
Sodium: 138

## 2010-12-12 LAB — BASIC METABOLIC PANEL
Calcium: 7.9 — ABNORMAL LOW
Chloride: 101
Creatinine, Ser: 0.86
Creatinine, Ser: 0.96
GFR calc Af Amer: >60
GFR calc Af Amer: >60
GFR calc non Af Amer: >60
GFR calc non Af Amer: >60
Potassium: 3.8
Sodium: 135

## 2010-12-12 LAB — PROTIME-INR
INR: 1.1
INR: 1.4
INR: 1.4
INR: 1.5
Prothrombin Time: 13.5
Prothrombin Time: 14.5
Prothrombin Time: 17.3 — ABNORMAL HIGH
Prothrombin Time: 17.9 — ABNORMAL HIGH
Prothrombin Time: 18.7 — ABNORMAL HIGH

## 2010-12-12 LAB — TYPE AND SCREEN: ABO/RH(D): A POS

## 2010-12-12 LAB — GRAM STAIN

## 2010-12-18 LAB — COMPREHENSIVE METABOLIC PANEL
ALT: 35
AST: 17
Alkaline Phosphatase: 46
CO2: 23
Calcium: 9
GFR calc Af Amer: >60
Potassium: 3.9
Sodium: 134 — ABNORMAL LOW
Total Protein: 6.5

## 2010-12-18 LAB — CBC
HCT: 32 — ABNORMAL LOW
MCHC: 34.8
MCHC: 35.7
Platelets: 195
RBC: 3.02 — ABNORMAL LOW
RBC: 4.56
RDW: 12.9
WBC: 8.1

## 2010-12-18 LAB — PROTIME-INR
INR: 1.1
INR: 1.3
Prothrombin Time: 14.5
Prothrombin Time: 16 — ABNORMAL HIGH

## 2010-12-18 LAB — DIFFERENTIAL
Eosinophils Absolute: 0.2
Eosinophils Relative: 3
Lymphs Abs: 2.4
Monocytes Relative: 10

## 2010-12-18 LAB — TYPE AND SCREEN: Antibody Screen: NEGATIVE

## 2010-12-18 LAB — BASIC METABOLIC PANEL
BUN: 10
CO2: 26
Calcium: 7.8 — ABNORMAL LOW
Creatinine, Ser: 0.86
Creatinine, Ser: 0.88
GFR calc Af Amer: >60
GFR calc non Af Amer: >60
Potassium: 3.9

## 2010-12-18 LAB — ABO/RH: ABO/RH(D): A POS

## 2010-12-18 LAB — URINALYSIS, ROUTINE W REFLEX MICROSCOPIC
Nitrite: NEGATIVE
Specific Gravity, Urine: 1.016
Urobilinogen, UA: 0.2

## 2011-06-20 ENCOUNTER — Encounter: Payer: Self-pay | Admitting: Gastroenterology

## 2011-06-26 HISTORY — PX: POLYPECTOMY: SHX149

## 2011-07-02 ENCOUNTER — Encounter: Payer: Self-pay | Admitting: Gastroenterology

## 2011-07-03 ENCOUNTER — Other Ambulatory Visit: Payer: Self-pay | Admitting: Dermatology

## 2011-07-31 ENCOUNTER — Encounter: Payer: Self-pay | Admitting: *Deleted

## 2011-07-31 NOTE — Telephone Encounter (Signed)
error 

## 2011-08-01 ENCOUNTER — Encounter: Payer: Self-pay | Admitting: Gastroenterology

## 2011-08-01 ENCOUNTER — Ambulatory Visit (AMBULATORY_SURGERY_CENTER): Payer: BC Managed Care – PPO | Admitting: *Deleted

## 2011-08-01 VITALS — Ht 70.0 in | Wt 198.1 lb

## 2011-08-01 DIAGNOSIS — Z1211 Encounter for screening for malignant neoplasm of colon: Secondary | ICD-10-CM

## 2011-08-01 MED ORDER — MOVIPREP 100 G PO SOLR: ORAL | Status: AC

## 2011-08-07 ENCOUNTER — Telehealth: Payer: Self-pay | Admitting: Gastroenterology

## 2011-08-08 ENCOUNTER — Encounter: Payer: BC Managed Care – PPO | Admitting: Gastroenterology

## 2012-01-25 ENCOUNTER — Encounter: Payer: Self-pay | Admitting: Gastroenterology

## 2012-02-26 ENCOUNTER — Ambulatory Visit: Payer: BC Managed Care – PPO | Admitting: Gastroenterology

## 2012-03-26 NOTE — Telephone Encounter (Signed)
From: David R Patterson, MD °Sent: 03/14/2012 12:31 PM  °To: Kelly A Smith, CMA  °Patient's all need to be charged as there is some extenuating circumstance ° ° °

## 2012-04-04 ENCOUNTER — Encounter: Payer: Self-pay | Admitting: Gastroenterology

## 2013-09-07 DIAGNOSIS — E785 Hyperlipidemia, unspecified: Secondary | ICD-10-CM | POA: Diagnosis not present

## 2013-09-07 DIAGNOSIS — K219 Gastro-esophageal reflux disease without esophagitis: Secondary | ICD-10-CM | POA: Diagnosis not present

## 2013-09-14 ENCOUNTER — Other Ambulatory Visit (HOSPITAL_COMMUNITY): Payer: Self-pay | Admitting: Family Medicine

## 2013-09-14 DIAGNOSIS — E785 Hyperlipidemia, unspecified: Secondary | ICD-10-CM | POA: Diagnosis not present

## 2013-09-14 DIAGNOSIS — Z0000 Encounter for general adult medical examination without abnormal findings: Secondary | ICD-10-CM | POA: Diagnosis not present

## 2013-09-14 DIAGNOSIS — I10 Essential (primary) hypertension: Secondary | ICD-10-CM | POA: Diagnosis not present

## 2013-09-14 DIAGNOSIS — N400 Enlarged prostate without lower urinary tract symptoms: Secondary | ICD-10-CM | POA: Diagnosis not present

## 2013-09-14 DIAGNOSIS — Z6826 Body mass index (BMI) 26.0-26.9, adult: Secondary | ICD-10-CM | POA: Diagnosis not present

## 2013-09-22 ENCOUNTER — Other Ambulatory Visit (HOSPITAL_COMMUNITY): Payer: Self-pay | Admitting: Family Medicine

## 2013-09-22 DIAGNOSIS — Z0000 Encounter for general adult medical examination without abnormal findings: Secondary | ICD-10-CM

## 2013-09-25 ENCOUNTER — Ambulatory Visit (HOSPITAL_COMMUNITY): Payer: Medicare Other

## 2013-11-23 ENCOUNTER — Ambulatory Visit (HOSPITAL_COMMUNITY)
Admission: RE | Admit: 2013-11-23 | Discharge: 2013-11-23 | Disposition: A | Discharge status: Home / Self Care | Payer: Medicare Other | Source: Ambulatory Visit | Attending: Family Medicine | Admitting: Family Medicine

## 2013-11-23 DIAGNOSIS — Z1389 Encounter for screening for other disorder: Secondary | ICD-10-CM | POA: Diagnosis not present

## 2013-11-23 DIAGNOSIS — Z0000 Encounter for general adult medical examination without abnormal findings: Secondary | ICD-10-CM | POA: Diagnosis not present

## 2013-11-23 DIAGNOSIS — Z136 Encounter for screening for cardiovascular disorders: Secondary | ICD-10-CM | POA: Diagnosis not present

## 2014-01-02 DIAGNOSIS — Z23 Encounter for immunization: Secondary | ICD-10-CM | POA: Diagnosis not present

## 2014-02-15 DIAGNOSIS — Z6828 Body mass index (BMI) 28.0-28.9, adult: Secondary | ICD-10-CM | POA: Diagnosis not present

## 2014-02-15 DIAGNOSIS — J209 Acute bronchitis, unspecified: Secondary | ICD-10-CM | POA: Diagnosis not present

## 2014-02-15 DIAGNOSIS — J069 Acute upper respiratory infection, unspecified: Secondary | ICD-10-CM | POA: Diagnosis not present

## 2014-02-16 ENCOUNTER — Other Ambulatory Visit (HOSPITAL_COMMUNITY): Payer: Self-pay | Admitting: Family Medicine

## 2014-02-16 ENCOUNTER — Ambulatory Visit (HOSPITAL_COMMUNITY)
Admission: RE | Admit: 2014-02-16 | Discharge: 2014-02-16 | Disposition: A | Discharge status: Home / Self Care | Payer: Medicare Other | Source: Ambulatory Visit | Attending: Family Medicine | Admitting: Family Medicine

## 2014-02-16 DIAGNOSIS — M25512 Pain in left shoulder: Secondary | ICD-10-CM | POA: Insufficient documentation

## 2014-02-16 DIAGNOSIS — Z6828 Body mass index (BMI) 28.0-28.9, adult: Secondary | ICD-10-CM | POA: Diagnosis not present

## 2014-02-16 DIAGNOSIS — R079 Chest pain, unspecified: Secondary | ICD-10-CM

## 2014-02-16 DIAGNOSIS — R0781 Pleurodynia: Secondary | ICD-10-CM

## 2014-02-16 DIAGNOSIS — E663 Overweight: Secondary | ICD-10-CM | POA: Diagnosis not present

## 2014-06-28 DIAGNOSIS — D225 Melanocytic nevi of trunk: Secondary | ICD-10-CM | POA: Diagnosis not present

## 2014-06-28 DIAGNOSIS — L570 Actinic keratosis: Secondary | ICD-10-CM | POA: Diagnosis not present

## 2014-06-28 DIAGNOSIS — L814 Other melanin hyperpigmentation: Secondary | ICD-10-CM | POA: Diagnosis not present

## 2014-06-28 DIAGNOSIS — D2262 Melanocytic nevi of left upper limb, including shoulder: Secondary | ICD-10-CM | POA: Diagnosis not present

## 2014-06-28 DIAGNOSIS — D1801 Hemangioma of skin and subcutaneous tissue: Secondary | ICD-10-CM | POA: Diagnosis not present

## 2014-06-28 DIAGNOSIS — D2261 Melanocytic nevi of right upper limb, including shoulder: Secondary | ICD-10-CM | POA: Diagnosis not present

## 2014-06-28 DIAGNOSIS — L821 Other seborrheic keratosis: Secondary | ICD-10-CM | POA: Diagnosis not present

## 2014-07-12 ENCOUNTER — Other Ambulatory Visit (HOSPITAL_COMMUNITY): Payer: Self-pay | Admitting: Family Medicine

## 2014-07-12 ENCOUNTER — Ambulatory Visit (HOSPITAL_COMMUNITY)
Admission: RE | Admit: 2014-07-12 | Discharge: 2014-07-12 | Disposition: A | Discharge status: Home / Self Care | Payer: Medicare Other | Source: Ambulatory Visit | Attending: Family Medicine | Admitting: Family Medicine

## 2014-07-12 DIAGNOSIS — M5410 Radiculopathy, site unspecified: Secondary | ICD-10-CM | POA: Diagnosis not present

## 2014-07-12 DIAGNOSIS — M5136 Other intervertebral disc degeneration, lumbar region: Secondary | ICD-10-CM | POA: Diagnosis not present

## 2014-07-12 DIAGNOSIS — Z6828 Body mass index (BMI) 28.0-28.9, adult: Secondary | ICD-10-CM | POA: Diagnosis not present

## 2014-07-12 DIAGNOSIS — E663 Overweight: Secondary | ICD-10-CM | POA: Diagnosis not present

## 2014-07-12 DIAGNOSIS — M545 Low back pain: Secondary | ICD-10-CM | POA: Insufficient documentation

## 2014-07-13 DIAGNOSIS — M9903 Segmental and somatic dysfunction of lumbar region: Secondary | ICD-10-CM | POA: Diagnosis not present

## 2014-07-13 DIAGNOSIS — M545 Low back pain: Secondary | ICD-10-CM | POA: Diagnosis not present

## 2014-07-15 DIAGNOSIS — M9903 Segmental and somatic dysfunction of lumbar region: Secondary | ICD-10-CM | POA: Diagnosis not present

## 2014-07-15 DIAGNOSIS — M545 Low back pain: Secondary | ICD-10-CM | POA: Diagnosis not present

## 2014-07-16 DIAGNOSIS — M545 Low back pain: Secondary | ICD-10-CM | POA: Diagnosis not present

## 2014-07-16 DIAGNOSIS — M9903 Segmental and somatic dysfunction of lumbar region: Secondary | ICD-10-CM | POA: Diagnosis not present

## 2014-08-09 ENCOUNTER — Emergency Department (HOSPITAL_COMMUNITY)
Admission: EM | Admit: 2014-08-09 | Discharge: 2014-08-10 | Disposition: A | Discharge status: Home / Self Care | Payer: Medicare Other | Attending: Emergency Medicine | Admitting: Emergency Medicine

## 2014-08-09 ENCOUNTER — Encounter (HOSPITAL_COMMUNITY): Payer: Self-pay

## 2014-08-09 DIAGNOSIS — Z791 Long term (current) use of non-steroidal anti-inflammatories (NSAID): Secondary | ICD-10-CM | POA: Insufficient documentation

## 2014-08-09 DIAGNOSIS — Z79899 Other long term (current) drug therapy: Secondary | ICD-10-CM | POA: Insufficient documentation

## 2014-08-09 DIAGNOSIS — Z7982 Long term (current) use of aspirin: Secondary | ICD-10-CM | POA: Insufficient documentation

## 2014-08-09 DIAGNOSIS — Z87891 Personal history of nicotine dependence: Secondary | ICD-10-CM | POA: Diagnosis not present

## 2014-08-09 DIAGNOSIS — R7989 Other specified abnormal findings of blood chemistry: Secondary | ICD-10-CM

## 2014-08-09 DIAGNOSIS — R748 Abnormal levels of other serum enzymes: Secondary | ICD-10-CM | POA: Diagnosis not present

## 2014-08-09 DIAGNOSIS — N200 Calculus of kidney: Secondary | ICD-10-CM | POA: Insufficient documentation

## 2014-08-09 DIAGNOSIS — N132 Hydronephrosis with renal and ureteral calculous obstruction: Secondary | ICD-10-CM | POA: Diagnosis not present

## 2014-08-09 DIAGNOSIS — R110 Nausea: Secondary | ICD-10-CM | POA: Diagnosis not present

## 2014-08-09 DIAGNOSIS — I10 Essential (primary) hypertension: Secondary | ICD-10-CM | POA: Insufficient documentation

## 2014-08-09 DIAGNOSIS — R109 Unspecified abdominal pain: Secondary | ICD-10-CM | POA: Diagnosis present

## 2014-08-09 LAB — CBC WITH DIFFERENTIAL/PLATELET
Basophils Absolute: 0 10*3/uL (ref 0.0–0.1)
Basophils Relative: 0 % (ref 0–1)
EOS PCT: 2 % (ref 0–5)
Eosinophils Absolute: 0.2 10*3/uL (ref 0.0–0.7)
HEMATOCRIT: 34.9 % — AB (ref 39.0–52.0)
Hemoglobin: 12.2 g/dL — ABNORMAL LOW (ref 13.0–17.0)
LYMPHS ABS: 1.5 10*3/uL (ref 0.7–4.0)
Lymphocytes Relative: 16 % (ref 12–46)
MCH: 34.1 pg — ABNORMAL HIGH (ref 26.0–34.0)
MCHC: 35 g/dL (ref 30.0–36.0)
MCV: 97.5 fL (ref 78.0–100.0)
MONO ABS: 0.7 10*3/uL (ref 0.1–1.0)
MONOS PCT: 7 % (ref 3–12)
Neutro Abs: 6.7 10*3/uL (ref 1.7–7.7)
Neutrophils Relative %: 75 % (ref 43–77)
Platelets: 180 10*3/uL (ref 150–400)
RBC: 3.58 MIL/uL — ABNORMAL LOW (ref 4.22–5.81)
RDW: 12.4 % (ref 11.5–15.5)
WBC: 9 10*3/uL (ref 4.0–10.5)

## 2014-08-09 LAB — BASIC METABOLIC PANEL
ANION GAP: 9 (ref 5–15)
BUN: 26 mg/dL — ABNORMAL HIGH (ref 6–20)
CHLORIDE: 101 mmol/L (ref 101–111)
CO2: 24 mmol/L (ref 22–32)
Calcium: 8.8 mg/dL — ABNORMAL LOW (ref 8.9–10.3)
Creatinine, Ser: 1.93 mg/dL — ABNORMAL HIGH (ref 0.61–1.24)
GFR, EST AFRICAN AMERICAN: 40 mL/min — AB (ref >60)
GFR, EST NON AFRICAN AMERICAN: 35 mL/min — AB (ref >60)
GLUCOSE: 115 mg/dL — AB (ref 65–99)
POTASSIUM: 3.7 mmol/L (ref 3.5–5.1)
Sodium: 134 mmol/L — ABNORMAL LOW (ref 135–145)

## 2014-08-09 MED ORDER — HYDROMORPHONE HCL 1 MG/ML IJ SOLN
1.0000 mg | Freq: Once | INTRAMUSCULAR | Status: AC
  Administered 2014-08-09: 1 mg via INTRAMUSCULAR
  Filled 2014-08-09: qty 1

## 2014-08-09 MED ORDER — KETOROLAC TROMETHAMINE 60 MG/2ML IM SOLN
30.0000 mg | Freq: Once | INTRAMUSCULAR | Status: AC
  Administered 2014-08-09: 30 mg via INTRAMUSCULAR
  Filled 2014-08-09: qty 2

## 2014-08-09 NOTE — ED Notes (Signed)
Pt complains of left lower abdominal pain since yesterday, pt vomited yesterday

## 2014-08-10 ENCOUNTER — Emergency Department (HOSPITAL_COMMUNITY): Payer: Medicare Other

## 2014-08-10 DIAGNOSIS — N200 Calculus of kidney: Secondary | ICD-10-CM | POA: Diagnosis not present

## 2014-08-10 DIAGNOSIS — N132 Hydronephrosis with renal and ureteral calculous obstruction: Secondary | ICD-10-CM | POA: Diagnosis not present

## 2014-08-10 LAB — URINALYSIS, ROUTINE W REFLEX MICROSCOPIC
Bilirubin Urine: NEGATIVE
Glucose, UA: NEGATIVE mg/dL
KETONES UR: NEGATIVE mg/dL
LEUKOCYTES UA: NEGATIVE
NITRITE: NEGATIVE
PH: 5 (ref 5.0–8.0)
Protein, ur: NEGATIVE mg/dL
SPECIFIC GRAVITY, URINE: 1.015 (ref 1.005–1.030)
Urobilinogen, UA: 0.2 mg/dL (ref 0.0–1.0)

## 2014-08-10 LAB — URINE MICROSCOPIC-ADD ON

## 2014-08-10 MED ORDER — SODIUM CHLORIDE 0.9 % IV BOLUS (SEPSIS)
1000.0000 mL | Freq: Once | INTRAVENOUS | Status: AC
  Administered 2014-08-10: 1000 mL via INTRAVENOUS

## 2014-08-10 MED ORDER — HYDROMORPHONE HCL 1 MG/ML IJ SOLN
0.5000 mg | Freq: Once | INTRAMUSCULAR | Status: AC
  Administered 2014-08-10: 0.5 mg via INTRAVENOUS
  Filled 2014-08-10: qty 1

## 2014-08-10 MED ORDER — IOHEXOL 300 MG/ML  SOLN
50.0000 mL | Freq: Once | INTRAMUSCULAR | Status: AC | PRN
  Administered 2014-08-10: 50 mL via ORAL

## 2014-08-10 MED ORDER — OXYCODONE-ACETAMINOPHEN 5-325 MG PO TABS
2.0000 | ORAL_TABLET | Freq: Once | ORAL | Status: AC
  Administered 2014-08-10: 2 via ORAL
  Filled 2014-08-10: qty 2

## 2014-08-10 MED ORDER — ONDANSETRON HCL 4 MG PO TABS: 4.0000 mg | ORAL_TABLET | Freq: Four times a day (QID) | ORAL | Status: AC

## 2014-08-10 MED ORDER — OXYCODONE-ACETAMINOPHEN 5-325 MG PO TABS: 1.0000 | ORAL_TABLET | Freq: Four times a day (QID) | ORAL | Status: AC | PRN

## 2014-08-10 NOTE — Discharge Instructions (Signed)
Follow-up with a urologist to ensure resolution of symptoms. You may take Percocet as prescribed for pain control and Zofran as needed for nausea/vomiting. Have your kidney function rechecked by either a urologist or your primary doctor in 1 week. Return to the emergency department as needed if symptoms worsen.  Kidney Stones Kidney stones (urolithiasis) are deposits that form inside your kidneys. The intense pain is caused by the stone moving through the urinary tract. When the stone moves, the ureter goes into spasm around the stone. The stone is usually passed in the urine.  CAUSES   A disorder that makes certain neck glands produce too much parathyroid hormone (primary hyperparathyroidism).  A buildup of uric acid crystals, similar to gout in your joints.  Narrowing (stricture) of the ureter.  A kidney obstruction present at birth (congenital obstruction).  Previous surgery on the kidney or ureters.  Numerous kidney infections. SYMPTOMS   Feeling sick to your stomach (nauseous).  Throwing up (vomiting).  Blood in the urine (hematuria).  Pain that usually spreads (radiates) to the groin.  Frequency or urgency of urination. DIAGNOSIS   Taking a history and physical exam.  Blood or urine tests.  CT scan.  Occasionally, an examination of the inside of the urinary bladder (cystoscopy) is performed. TREATMENT   Observation.  Increasing your fluid intake.  Extracorporeal shock wave lithotripsy--This is a noninvasive procedure that uses shock waves to break up kidney stones.  Surgery may be needed if you have severe pain or persistent obstruction. There are various surgical procedures. Most of the procedures are performed with the use of small instruments. Only small incisions are needed to accommodate these instruments, so recovery time is minimized. The size, location, and chemical composition are all important variables that will determine the proper choice of action for  you. Talk to your health care provider to better understand your situation so that you will minimize the risk of injury to yourself and your kidney.  HOME CARE INSTRUCTIONS   Drink enough water and fluids to keep your urine clear or pale yellow. This will help you to pass the stone or stone fragments.  Strain all urine through the provided strainer. Keep all particulate matter and stones for your health care provider to see. The stone causing the pain may be as small as a grain of salt. It is very important to use the strainer each and every time you pass your urine. The collection of your stone will allow your health care provider to analyze it and verify that a stone has actually passed. The stone analysis will often identify what you can do to reduce the incidence of recurrences.  Only take over-the-counter or prescription medicines for pain, discomfort, or fever as directed by your health care provider.  Make a follow-up appointment with your health care provider as directed.  Get follow-up X-rays if required. The absence of pain does not always mean that the stone has passed. It may have only stopped moving. If the urine remains completely obstructed, it can cause loss of kidney function or even complete destruction of the kidney. It is your responsibility to make sure X-rays and follow-ups are completed. Ultrasounds of the kidney can show blockages and the status of the kidney. Ultrasounds are not associated with any radiation and can be performed easily in a matter of minutes. SEEK MEDICAL CARE IF:  You experience pain that is progressive and unresponsive to any pain medicine you have been prescribed. SEEK IMMEDIATE MEDICAL CARE IF:  Pain cannot be controlled with the prescribed medicine.  You have a fever or shaking chills.  The severity or intensity of pain increases over 18 hours and is not relieved by pain medicine.  You develop a new onset of abdominal pain.  You feel faint or  pass out.  You are unable to urinate. MAKE SURE YOU:   Understand these instructions.  Will watch your condition.  Will get help right away if you are not doing well or get worse. Document Released: 02/26/2005 Document Revised: 10/29/2012 Document Reviewed: 07/30/2012 [facility] Patient Information 2015 [place], [place]. This information is not intended to replace advice given to you by your health care provider. Make sure you discuss any questions you have with your health care provider.

## 2014-08-10 NOTE — ED Notes (Signed)
Patient transported to CT 

## 2014-08-10 NOTE — ED Notes (Addendum)
Pt states he feels like he has a knot in his colon on L side, states gave himself 2 enemas at home d/t constipation past couple days, states was having severe LQ abdominal pain.

## 2014-08-10 NOTE — ED Provider Notes (Signed)
CSN: [id number]     Arrival date & time 08/09/14  2243 History   First MD Initiated Contact with Patient 08/10/14 0047     Chief Complaint  Patient presents with  . Abdominal Pain    (Consider location/radiation/quality/duration/timing/severity/associated sxs/prior Treatment) HPI Comments: 64 year old male with a history of hypertension presents to the emergency department for further evaluation of left lower quadrant abdominal pain. Pain began yesterday and was intermittent. It recurred this evening and has been constant since this time and worsening. Patient tried taking 2 enemas prior to arrival as he felt the need to defecate. This provided him no relief. He also tried using magnesium citrate which also did not help his pain. Patient reports nausea with dry heaving. No vomiting. No associated fever, chest pain, shortness of breath, dysuria or hematuria, penile or scrotal pain, or melanoma/medic easier. No history of abdominal surgeries. Patient states "it feels like my colon is in a knot".  Patient is a 64 y.o. male presenting with abdominal pain. The history is provided by the patient. No language interpreter was used.  Abdominal Pain Associated symptoms: nausea     Past Medical History  Diagnosis Date  . Hypertension    Past Surgical History  Procedure Laterality Date  . Polypectomy  06/26/11  . Colonoscopy  06/26/2006  . Total knee arthroplasty      x2   Family History  Problem Relation Age of Onset  . Heart disease Mother    History  Substance Use Topics  . Smoking status: Former Smoker    Quit date: 04/02/1988  . Smokeless tobacco: Never Used  . Alcohol Use: 4.2 oz/week    7 Glasses of wine per week    Review of Systems  Gastrointestinal: Positive for nausea and abdominal pain.  All other systems reviewed and are negative.   Allergies  Review of patient's allergies indicates no known allergies.  Home Medications   Prior to Admission medications   Medication  Sig Start Date End Date Taking? Authorizing Provider  amLODipine (NORVASC) 5 MG tablet Take 5 mg by mouth daily. 05/17/14  Yes Historical Provider, MD  aspirin EC 81 MG tablet Take 81 mg by mouth daily.   Yes Historical Provider, MD  losartan (COZAAR) 50 MG tablet Take 50 mg by mouth daily. 05/17/14  Yes Historical Provider, MD  meloxicam (MOBIC) 15 MG tablet Take 15 mg by mouth daily. 07/22/14  Yes Historical Provider, MD  Multiple Vitamins-Minerals (MULTIVITAMIN WITH MINERALS) tablet Take 1 tablet by mouth daily.   Yes Historical Provider, MD  omeprazole (PRILOSEC) 40 MG capsule Take 40 mg by mouth daily. 06/19/14  Yes Historical Provider, MD  Tamsulosin HCl (FLOMAX) 0.4 MG CAPS Take 0.4 mg by mouth Daily.  05/21/11  Yes Historical Provider, MD  MOVIPREP 100 G SOLR movi prep as directed Patient not taking: Reported on 08/09/2014 08/01/11   Sable Feil, MD  ondansetron (ZOFRAN) 4 MG tablet Take 1 tablet (4 mg total) by mouth every 6 (six) hours. As needed for nausea/vomiting 08/10/14   Antonietta Breach, PA-C  oxyCODONE-acetaminophen (PERCOCET/ROXICET) 5-325 MG per tablet Take 1-2 tablets by mouth every 6 (six) hours as needed for severe pain. 08/10/14   Antonietta Breach, PA-C   BP 138/71 mmHg  Pulse 77  Temp(Src) 97.9 F (36.6 C) (Oral)  Resp 16  Ht 5\' 11"  (1.803 m)  Wt 194 lb (87.998 kg)  BMI 27.07 kg/m2  SpO2 98%   Physical Exam  Constitutional: He is oriented to person,  place, and time. He appears well-developed and well-nourished. No distress.  Nontoxic/nonseptic appearing  HENT:  Head: Normocephalic and atraumatic.  Eyes: Conjunctivae and EOM are normal. No scleral icterus.  Neck: Normal range of motion.  Cardiovascular: Normal rate, regular rhythm and intact distal pulses.   Pulmonary/Chest: Effort normal. No respiratory distress. He has no wheezes.  Respirations even and unlabored  Abdominal: Soft. Normal appearance. He exhibits no distension. There is tenderness. There is no rebound and no  guarding.    No masses or peritoneal signs. Abdomen soft.  Musculoskeletal: Normal range of motion.  Neurological: He is alert and oriented to person, place, and time. He exhibits normal muscle tone. Coordination normal.  Skin: Skin is warm and dry. No rash noted. He is not diaphoretic. No erythema. No pallor.  Psychiatric: He has a normal mood and affect. His behavior is normal.  Nursing note and vitals reviewed.   ED Course  Procedures (including critical care time) Labs Review Labs Reviewed  URINALYSIS, ROUTINE W REFLEX MICROSCOPIC (NOT AT [facility]) - Abnormal; Notable for the following:    Hgb urine dipstick MODERATE (*)    All other components within normal limits  CBC WITH DIFFERENTIAL/PLATELET - Abnormal; Notable for the following:    RBC 3.58 (*)    Hemoglobin 12.2 (*)    HCT 34.9 (*)    MCH 34.1 (*)    All other components within normal limits  BASIC METABOLIC PANEL - Abnormal; Notable for the following:    Sodium 134 (*)    Glucose, Bld 115 (*)    BUN 26 (*)    Creatinine, Ser 1.93 (*)    Calcium 8.8 (*)    GFR calc non Af Amer 35 (*)    GFR calc Af Amer 40 (*)    All other components within normal limits  URINE MICROSCOPIC-ADD ON    Imaging Review Ct Abdomen Pelvis Wo Contrast  08/10/2014   CLINICAL DATA:  Acute onset of left lower quadrant abdominal pain and constipation. Microhematuria. Initial encounter.  EXAM: CT ABDOMEN AND PELVIS WITHOUT CONTRAST  TECHNIQUE: Multidetector CT imaging of the abdomen and pelvis was performed following the standard protocol without IV contrast.  COMPARISON:  Aortic ultrasound performed 11/23/2013  FINDINGS: The visualized lung bases are clear. A small to moderate hiatal hernia is seen. Diffuse coronary artery calcifications are seen.  The liver and spleen are unremarkable in appearance. The gallbladder is within normal limits. The pancreas and adrenal glands are unremarkable.  There is mild-to-moderate left-sided hydronephrosis, with  asymmetric left-sided perinephric stranding and fluid. This reflects an obstructing 5 x 3 mm stone in the proximal left ureter, just below the left renal pelvis. Scattered nonobstructing bilateral renal stones are seen, measuring up to 5 mm in size. Mild nonspecific right-sided perinephric stranding is noted.  No free fluid is identified. The small bowel is unremarkable in appearance. The stomach is within normal limits. No acute vascular abnormalities are seen. Scattered calcification is seen along the abdominal aorta and its branches.  The appendix is normal in caliber and contains trace air, without evidence of appendicitis. Scattered diverticulosis is noted along the sigmoid colon, without evidence of diverticulitis. There is a relatively small amount of stool in the colon, without evidence of constipation.  The bladder is mildly distended grossly unremarkable. The prostate remains borderline normal in size, with scattered calcification. A small left inguinal hernia is seen, containing only fat. The patient is status post vasectomy. No inguinal lymphadenopathy is seen.  No  acute osseous abnormalities are identified. Multilevel vacuum phenomenon is noted along the lumbar spine, with scattered endplate sclerotic change and multilevel disc space narrowing.  IMPRESSION: 1. Mild-to-moderate left-sided hydronephrosis, with an obstructing 5 x 3 mm stone in the proximal left ureter, just below the left renal pelvis. 2. Scattered nonobstructing bilateral renal stones, measuring up to 5 mm in size. 3. Small to moderate hiatal hernia seen. 4. Diffuse coronary artery calcifications noted. 5. Scattered calcification along the abdominal aorta and its branches. 6. Scattered diverticulosis along the sigmoid colon, without evidence of diverticulitis. 7. Small left inguinal hernia, containing only fat. 8. 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   Electronically Signed   By: Garald Balding M.D.   On: 08/10/2014  04:03     EKG Interpretation None      MDM   Final diagnoses:  Kidney stone on left side  Elevated serum creatinine    Pt has been diagnosed with a kidney stone via CT. There is no evidence of significant hydronephrosis, vitals sign stable, and the pt does not have irratractable vomiting. Creatinine is elevated to 1.93 today. This may represent acute on chronic worsening, given mild hydronephrosis; however, no recent labs for comparison. Will have patient f/u with his PCP for recheck of his kidney function in 1 week. Patient will be discharged home with pain medications and urology referral. He is already on Flomax. Return precautions discussed and provided. Patient agreeable to plan with no unaddressed concerns. He expresses gratitude for the care he has received this evening.   Filed Vitals:   08/09/14 2303 08/10/14 0017 08/10/14 0246  BP: 129/92 128/73 138/71  Pulse: 93 77 77  Temp: 97.9 F (36.6 C)    TempSrc: Oral    Resp: 20 16 16   Height: 5\' 11"  (1.803 m)    Weight: 194 lb (87.998 kg)    SpO2: 97% 96% 98%        Antonietta Breach, PA-C 08/10/14 [address], MD 08/10/14 [phone]

## 2014-08-11 DIAGNOSIS — R3912 Poor urinary stream: Secondary | ICD-10-CM | POA: Diagnosis not present

## 2014-08-11 DIAGNOSIS — N200 Calculus of kidney: Secondary | ICD-10-CM | POA: Diagnosis not present

## 2014-08-11 DIAGNOSIS — R351 Nocturia: Secondary | ICD-10-CM | POA: Diagnosis not present

## 2014-08-13 ENCOUNTER — Other Ambulatory Visit: Payer: Self-pay | Admitting: Urology

## 2014-08-13 DIAGNOSIS — N200 Calculus of kidney: Secondary | ICD-10-CM | POA: Diagnosis not present

## 2014-08-13 DIAGNOSIS — N201 Calculus of ureter: Secondary | ICD-10-CM | POA: Diagnosis not present

## 2014-08-16 ENCOUNTER — Encounter (HOSPITAL_BASED_OUTPATIENT_CLINIC_OR_DEPARTMENT_OTHER): Payer: Self-pay | Admitting: *Deleted

## 2014-08-16 NOTE — Progress Notes (Signed)
Pt instructed npo p mn 6/13 x omeprazole, amlodipine w sip of water.to [facility] 6/14 @ 1130.  Needs istat on arrival.

## 2014-09-01 ENCOUNTER — Ambulatory Visit (HOSPITAL_BASED_OUTPATIENT_CLINIC_OR_DEPARTMENT_OTHER): Admission: RE | Admit: 2014-09-01 | Payer: Medicare Other | Source: Ambulatory Visit | Admitting: Urology

## 2014-09-01 HISTORY — DX: Hematuria, unspecified: R31.9

## 2014-09-01 HISTORY — DX: Unspecified hearing loss, unspecified ear: H91.90

## 2014-09-01 HISTORY — DX: Gastro-esophageal reflux disease without esophagitis: K21.9

## 2014-09-01 HISTORY — DX: Unspecified osteoarthritis, unspecified site: M19.90

## 2014-09-01 HISTORY — DX: Nausea with vomiting, unspecified: R11.2

## 2014-09-01 HISTORY — DX: Presence of spectacles and contact lenses: Z97.3

## 2014-09-01 HISTORY — DX: Other neuromuscular dysfunction of bladder: N31.8

## 2014-09-01 HISTORY — DX: Acute upper respiratory infection, unspecified: J06.9

## 2014-09-01 HISTORY — DX: Calculus of kidney: N20.0

## 2014-09-01 SURGERY — CYSTOURETEROSCOPY, WITH RETROGRADE PYELOGRAM AND STENT INSERTION
Anesthesia: General | Laterality: Left

## 2014-09-06 ENCOUNTER — Encounter: Payer: Self-pay | Admitting: Gastroenterology

## 2014-09-06 DIAGNOSIS — N201 Calculus of ureter: Secondary | ICD-10-CM | POA: Diagnosis not present

## 2014-09-23 DIAGNOSIS — N451 Epididymitis: Secondary | ICD-10-CM | POA: Diagnosis not present

## 2014-10-13 DIAGNOSIS — F17210 Nicotine dependence, cigarettes, uncomplicated: Secondary | ICD-10-CM | POA: Diagnosis not present

## 2014-10-13 DIAGNOSIS — I10 Essential (primary) hypertension: Secondary | ICD-10-CM | POA: Diagnosis not present

## 2014-10-13 DIAGNOSIS — E663 Overweight: Secondary | ICD-10-CM | POA: Diagnosis not present

## 2014-10-13 DIAGNOSIS — R918 Other nonspecific abnormal finding of lung field: Secondary | ICD-10-CM | POA: Diagnosis not present

## 2014-10-13 DIAGNOSIS — Z125 Encounter for screening for malignant neoplasm of prostate: Secondary | ICD-10-CM | POA: Diagnosis not present

## 2014-10-13 DIAGNOSIS — K219 Gastro-esophageal reflux disease without esophagitis: Secondary | ICD-10-CM | POA: Diagnosis not present

## 2014-10-13 DIAGNOSIS — Z87442 Personal history of urinary calculi: Secondary | ICD-10-CM | POA: Diagnosis not present

## 2014-10-13 DIAGNOSIS — K449 Diaphragmatic hernia without obstruction or gangrene: Secondary | ICD-10-CM | POA: Diagnosis not present

## 2014-10-13 DIAGNOSIS — E780 Pure hypercholesterolemia: Secondary | ICD-10-CM | POA: Diagnosis not present

## 2014-10-13 DIAGNOSIS — Z0000 Encounter for general adult medical examination without abnormal findings: Secondary | ICD-10-CM | POA: Diagnosis not present

## 2014-10-13 DIAGNOSIS — Z6829 Body mass index (BMI) 29.0-29.9, adult: Secondary | ICD-10-CM | POA: Diagnosis not present

## 2014-10-28 DIAGNOSIS — Z6828 Body mass index (BMI) 28.0-28.9, adult: Secondary | ICD-10-CM | POA: Diagnosis not present

## 2014-10-28 DIAGNOSIS — Z1389 Encounter for screening for other disorder: Secondary | ICD-10-CM | POA: Diagnosis not present

## 2014-10-28 DIAGNOSIS — E782 Mixed hyperlipidemia: Secondary | ICD-10-CM | POA: Diagnosis not present

## 2014-10-28 DIAGNOSIS — E663 Overweight: Secondary | ICD-10-CM | POA: Diagnosis not present

## 2014-12-30 DIAGNOSIS — Z1389 Encounter for screening for other disorder: Secondary | ICD-10-CM | POA: Diagnosis not present

## 2014-12-30 DIAGNOSIS — Z6829 Body mass index (BMI) 29.0-29.9, adult: Secondary | ICD-10-CM | POA: Diagnosis not present

## 2014-12-30 DIAGNOSIS — Z23 Encounter for immunization: Secondary | ICD-10-CM | POA: Diagnosis not present

## 2014-12-30 DIAGNOSIS — N63 Unspecified lump in breast: Secondary | ICD-10-CM | POA: Diagnosis not present

## 2014-12-30 DIAGNOSIS — E663 Overweight: Secondary | ICD-10-CM | POA: Diagnosis not present

## 2015-01-11 DIAGNOSIS — N63 Unspecified lump in breast: Secondary | ICD-10-CM | POA: Diagnosis not present

## 2015-01-11 DIAGNOSIS — R928 Other abnormal and inconclusive findings on diagnostic imaging of breast: Secondary | ICD-10-CM | POA: Diagnosis not present

## 2015-01-17 DIAGNOSIS — E663 Overweight: Secondary | ICD-10-CM | POA: Diagnosis not present

## 2015-01-17 DIAGNOSIS — R7989 Other specified abnormal findings of blood chemistry: Secondary | ICD-10-CM | POA: Diagnosis not present

## 2015-01-17 DIAGNOSIS — Z6829 Body mass index (BMI) 29.0-29.9, adult: Secondary | ICD-10-CM | POA: Diagnosis not present

## 2015-01-17 DIAGNOSIS — Z1389 Encounter for screening for other disorder: Secondary | ICD-10-CM | POA: Diagnosis not present

## 2015-04-11 DIAGNOSIS — Z1389 Encounter for screening for other disorder: Secondary | ICD-10-CM | POA: Diagnosis not present

## 2015-04-11 DIAGNOSIS — Z6827 Body mass index (BMI) 27.0-27.9, adult: Secondary | ICD-10-CM | POA: Diagnosis not present

## 2015-04-11 DIAGNOSIS — J069 Acute upper respiratory infection, unspecified: Secondary | ICD-10-CM | POA: Diagnosis not present

## 2015-04-11 DIAGNOSIS — E538 Deficiency of other specified B group vitamins: Secondary | ICD-10-CM | POA: Diagnosis not present

## 2015-04-11 DIAGNOSIS — J0100 Acute maxillary sinusitis, unspecified: Secondary | ICD-10-CM | POA: Diagnosis not present

## 2015-04-11 DIAGNOSIS — R7989 Other specified abnormal findings of blood chemistry: Secondary | ICD-10-CM | POA: Diagnosis not present

## 2015-05-26 DIAGNOSIS — Z471 Aftercare following joint replacement surgery: Secondary | ICD-10-CM | POA: Diagnosis not present

## 2015-05-26 DIAGNOSIS — Z96651 Presence of right artificial knee joint: Secondary | ICD-10-CM | POA: Diagnosis not present

## 2015-05-30 DIAGNOSIS — H2513 Age-related nuclear cataract, bilateral: Secondary | ICD-10-CM | POA: Diagnosis not present

## 2015-06-06 DIAGNOSIS — R7989 Other specified abnormal findings of blood chemistry: Secondary | ICD-10-CM | POA: Diagnosis not present

## 2015-06-06 DIAGNOSIS — E782 Mixed hyperlipidemia: Secondary | ICD-10-CM | POA: Diagnosis not present

## 2015-06-06 DIAGNOSIS — Z6827 Body mass index (BMI) 27.0-27.9, adult: Secondary | ICD-10-CM | POA: Diagnosis not present

## 2015-06-06 DIAGNOSIS — J069 Acute upper respiratory infection, unspecified: Secondary | ICD-10-CM | POA: Diagnosis not present

## 2015-06-06 DIAGNOSIS — I10 Essential (primary) hypertension: Secondary | ICD-10-CM | POA: Diagnosis not present

## 2015-06-06 DIAGNOSIS — E559 Vitamin D deficiency, unspecified: Secondary | ICD-10-CM | POA: Diagnosis not present

## 2015-06-06 DIAGNOSIS — Z1389 Encounter for screening for other disorder: Secondary | ICD-10-CM | POA: Diagnosis not present

## 2015-06-06 DIAGNOSIS — R200 Anesthesia of skin: Secondary | ICD-10-CM | POA: Diagnosis not present

## 2015-06-06 DIAGNOSIS — N400 Enlarged prostate without lower urinary tract symptoms: Secondary | ICD-10-CM | POA: Diagnosis not present

## 2015-06-07 DIAGNOSIS — D649 Anemia, unspecified: Secondary | ICD-10-CM | POA: Diagnosis not present

## 2015-06-07 DIAGNOSIS — E538 Deficiency of other specified B group vitamins: Secondary | ICD-10-CM | POA: Diagnosis not present

## 2015-06-22 DIAGNOSIS — M545 Low back pain: Secondary | ICD-10-CM | POA: Diagnosis not present

## 2015-06-22 DIAGNOSIS — G8929 Other chronic pain: Secondary | ICD-10-CM | POA: Diagnosis not present

## 2015-06-29 DIAGNOSIS — D2261 Melanocytic nevi of right upper limb, including shoulder: Secondary | ICD-10-CM | POA: Diagnosis not present

## 2015-06-29 DIAGNOSIS — L821 Other seborrheic keratosis: Secondary | ICD-10-CM | POA: Diagnosis not present

## 2015-06-29 DIAGNOSIS — D225 Melanocytic nevi of trunk: Secondary | ICD-10-CM | POA: Diagnosis not present

## 2015-06-29 DIAGNOSIS — L814 Other melanin hyperpigmentation: Secondary | ICD-10-CM | POA: Diagnosis not present

## 2015-06-29 DIAGNOSIS — L720 Epidermal cyst: Secondary | ICD-10-CM | POA: Diagnosis not present

## 2015-06-30 DIAGNOSIS — J0100 Acute maxillary sinusitis, unspecified: Secondary | ICD-10-CM | POA: Diagnosis not present

## 2015-06-30 DIAGNOSIS — Z1389 Encounter for screening for other disorder: Secondary | ICD-10-CM | POA: Diagnosis not present

## 2015-06-30 DIAGNOSIS — J209 Acute bronchitis, unspecified: Secondary | ICD-10-CM | POA: Diagnosis not present

## 2015-06-30 DIAGNOSIS — Z6827 Body mass index (BMI) 27.0-27.9, adult: Secondary | ICD-10-CM | POA: Diagnosis not present

## 2015-09-27 DIAGNOSIS — Q398 Other congenital malformations of esophagus: Secondary | ICD-10-CM | POA: Diagnosis not present

## 2015-09-27 DIAGNOSIS — Z1211 Encounter for screening for malignant neoplasm of colon: Secondary | ICD-10-CM | POA: Diagnosis not present

## 2015-09-30 DIAGNOSIS — Z1211 Encounter for screening for malignant neoplasm of colon: Secondary | ICD-10-CM | POA: Diagnosis not present

## 2015-09-30 DIAGNOSIS — K635 Polyp of colon: Secondary | ICD-10-CM | POA: Diagnosis not present

## 2015-09-30 DIAGNOSIS — K2210 Ulcer of esophagus without bleeding: Secondary | ICD-10-CM | POA: Diagnosis not present

## 2015-09-30 DIAGNOSIS — Z01818 Encounter for other preprocedural examination: Secondary | ICD-10-CM | POA: Diagnosis not present

## 2015-09-30 DIAGNOSIS — Z86010 Personal history of colonic polyps: Secondary | ICD-10-CM | POA: Diagnosis not present

## 2015-10-10 DIAGNOSIS — E663 Overweight: Secondary | ICD-10-CM | POA: Diagnosis not present

## 2015-10-10 DIAGNOSIS — H6060 Unspecified chronic otitis externa, unspecified ear: Secondary | ICD-10-CM | POA: Diagnosis not present

## 2015-10-10 DIAGNOSIS — R7989 Other specified abnormal findings of blood chemistry: Secondary | ICD-10-CM | POA: Diagnosis not present

## 2015-10-10 DIAGNOSIS — Z6827 Body mass index (BMI) 27.0-27.9, adult: Secondary | ICD-10-CM | POA: Diagnosis not present

## 2015-10-10 DIAGNOSIS — G894 Chronic pain syndrome: Secondary | ICD-10-CM | POA: Diagnosis not present

## 2015-10-10 DIAGNOSIS — K6389 Other specified diseases of intestine: Secondary | ICD-10-CM | POA: Diagnosis not present

## 2015-10-10 DIAGNOSIS — K635 Polyp of colon: Secondary | ICD-10-CM | POA: Diagnosis not present

## 2015-10-11 DIAGNOSIS — Q398 Other congenital malformations of esophagus: Secondary | ICD-10-CM | POA: Diagnosis not present

## 2015-10-11 DIAGNOSIS — K259 Gastric ulcer, unspecified as acute or chronic, without hemorrhage or perforation: Secondary | ICD-10-CM | POA: Diagnosis not present

## 2015-10-12 DIAGNOSIS — K5281 Eosinophilic gastritis or gastroenteritis: Secondary | ICD-10-CM | POA: Diagnosis not present

## 2015-10-12 DIAGNOSIS — K2270 Barrett's esophagus without dysplasia: Secondary | ICD-10-CM | POA: Diagnosis not present

## 2015-10-12 DIAGNOSIS — A048 Other specified bacterial intestinal infections: Secondary | ICD-10-CM | POA: Diagnosis not present

## 2015-10-17 DIAGNOSIS — I2510 Atherosclerotic heart disease of native coronary artery without angina pectoris: Secondary | ICD-10-CM | POA: Diagnosis not present

## 2015-10-17 DIAGNOSIS — Z125 Encounter for screening for malignant neoplasm of prostate: Secondary | ICD-10-CM | POA: Diagnosis not present

## 2015-10-17 DIAGNOSIS — Z0000 Encounter for general adult medical examination without abnormal findings: Secondary | ICD-10-CM | POA: Diagnosis not present

## 2015-10-17 DIAGNOSIS — E663 Overweight: Secondary | ICD-10-CM | POA: Diagnosis not present

## 2015-10-17 DIAGNOSIS — K210 Gastro-esophageal reflux disease with esophagitis: Secondary | ICD-10-CM | POA: Diagnosis not present

## 2015-10-17 DIAGNOSIS — E7800 Pure hypercholesterolemia, unspecified: Secondary | ICD-10-CM | POA: Diagnosis not present

## 2015-10-17 DIAGNOSIS — D649 Anemia, unspecified: Secondary | ICD-10-CM | POA: Diagnosis not present

## 2015-11-02 DIAGNOSIS — K2210 Ulcer of esophagus without bleeding: Secondary | ICD-10-CM | POA: Diagnosis not present

## 2015-11-02 DIAGNOSIS — Z86010 Personal history of colonic polyps: Secondary | ICD-10-CM | POA: Diagnosis not present

## 2015-11-02 DIAGNOSIS — Q398 Other congenital malformations of esophagus: Secondary | ICD-10-CM | POA: Diagnosis not present

## 2015-11-02 DIAGNOSIS — K219 Gastro-esophageal reflux disease without esophagitis: Secondary | ICD-10-CM | POA: Diagnosis not present

## 2015-11-24 DIAGNOSIS — M7541 Impingement syndrome of right shoulder: Secondary | ICD-10-CM | POA: Diagnosis not present

## 2015-12-14 DIAGNOSIS — M7541 Impingement syndrome of right shoulder: Secondary | ICD-10-CM | POA: Diagnosis not present

## 2015-12-22 DIAGNOSIS — I10 Essential (primary) hypertension: Secondary | ICD-10-CM | POA: Diagnosis not present

## 2015-12-22 DIAGNOSIS — E291 Testicular hypofunction: Secondary | ICD-10-CM | POA: Diagnosis not present

## 2015-12-22 DIAGNOSIS — M1991 Primary osteoarthritis, unspecified site: Secondary | ICD-10-CM | POA: Diagnosis not present

## 2015-12-22 DIAGNOSIS — Z6828 Body mass index (BMI) 28.0-28.9, adult: Secondary | ICD-10-CM | POA: Diagnosis not present

## 2015-12-22 DIAGNOSIS — G894 Chronic pain syndrome: Secondary | ICD-10-CM | POA: Diagnosis not present

## 2015-12-22 DIAGNOSIS — E663 Overweight: Secondary | ICD-10-CM | POA: Diagnosis not present

## 2015-12-22 DIAGNOSIS — R7989 Other specified abnormal findings of blood chemistry: Secondary | ICD-10-CM | POA: Diagnosis not present

## 2015-12-23 DIAGNOSIS — M7541 Impingement syndrome of right shoulder: Secondary | ICD-10-CM | POA: Diagnosis not present

## 2015-12-30 DIAGNOSIS — M7541 Impingement syndrome of right shoulder: Secondary | ICD-10-CM | POA: Diagnosis not present

## 2016-02-01 DIAGNOSIS — Z23 Encounter for immunization: Secondary | ICD-10-CM | POA: Diagnosis not present

## 2016-02-16 DIAGNOSIS — M7521 Bicipital tendinitis, right shoulder: Secondary | ICD-10-CM | POA: Diagnosis not present

## 2016-02-16 DIAGNOSIS — M7541 Impingement syndrome of right shoulder: Secondary | ICD-10-CM | POA: Diagnosis not present

## 2016-02-16 DIAGNOSIS — M9902 Segmental and somatic dysfunction of thoracic region: Secondary | ICD-10-CM | POA: Diagnosis not present

## 2016-02-16 DIAGNOSIS — M9901 Segmental and somatic dysfunction of cervical region: Secondary | ICD-10-CM | POA: Diagnosis not present

## 2016-02-20 DIAGNOSIS — M9902 Segmental and somatic dysfunction of thoracic region: Secondary | ICD-10-CM | POA: Diagnosis not present

## 2016-02-20 DIAGNOSIS — M7521 Bicipital tendinitis, right shoulder: Secondary | ICD-10-CM | POA: Diagnosis not present

## 2016-02-20 DIAGNOSIS — M7541 Impingement syndrome of right shoulder: Secondary | ICD-10-CM | POA: Diagnosis not present

## 2016-02-20 DIAGNOSIS — M9901 Segmental and somatic dysfunction of cervical region: Secondary | ICD-10-CM | POA: Diagnosis not present

## 2016-02-22 DIAGNOSIS — M7521 Bicipital tendinitis, right shoulder: Secondary | ICD-10-CM | POA: Diagnosis not present

## 2016-02-22 DIAGNOSIS — M7541 Impingement syndrome of right shoulder: Secondary | ICD-10-CM | POA: Diagnosis not present

## 2016-02-22 DIAGNOSIS — M9901 Segmental and somatic dysfunction of cervical region: Secondary | ICD-10-CM | POA: Diagnosis not present

## 2016-02-22 DIAGNOSIS — M9902 Segmental and somatic dysfunction of thoracic region: Secondary | ICD-10-CM | POA: Diagnosis not present

## 2016-02-27 DIAGNOSIS — E782 Mixed hyperlipidemia: Secondary | ICD-10-CM | POA: Diagnosis not present

## 2016-02-27 DIAGNOSIS — K219 Gastro-esophageal reflux disease without esophagitis: Secondary | ICD-10-CM | POA: Diagnosis not present

## 2016-02-27 DIAGNOSIS — J343 Hypertrophy of nasal turbinates: Secondary | ICD-10-CM | POA: Diagnosis not present

## 2016-02-27 DIAGNOSIS — E669 Obesity, unspecified: Secondary | ICD-10-CM | POA: Diagnosis not present

## 2016-02-27 DIAGNOSIS — J329 Chronic sinusitis, unspecified: Secondary | ICD-10-CM | POA: Diagnosis not present

## 2016-02-27 DIAGNOSIS — Z6827 Body mass index (BMI) 27.0-27.9, adult: Secondary | ICD-10-CM | POA: Diagnosis not present

## 2016-02-27 DIAGNOSIS — E663 Overweight: Secondary | ICD-10-CM | POA: Diagnosis not present

## 2016-02-27 DIAGNOSIS — I10 Essential (primary) hypertension: Secondary | ICD-10-CM | POA: Diagnosis not present

## 2016-02-27 DIAGNOSIS — R070 Pain in throat: Secondary | ICD-10-CM | POA: Diagnosis not present

## 2016-03-23 DIAGNOSIS — L723 Sebaceous cyst: Secondary | ICD-10-CM | POA: Diagnosis not present

## 2016-03-23 DIAGNOSIS — L720 Epidermal cyst: Secondary | ICD-10-CM | POA: Diagnosis not present

## 2016-07-18 DIAGNOSIS — L720 Epidermal cyst: Secondary | ICD-10-CM | POA: Diagnosis not present

## 2016-07-18 DIAGNOSIS — N400 Enlarged prostate without lower urinary tract symptoms: Secondary | ICD-10-CM | POA: Diagnosis not present

## 2016-07-18 DIAGNOSIS — L814 Other melanin hyperpigmentation: Secondary | ICD-10-CM | POA: Diagnosis not present

## 2016-07-18 DIAGNOSIS — I10 Essential (primary) hypertension: Secondary | ICD-10-CM | POA: Diagnosis not present

## 2016-07-18 DIAGNOSIS — L570 Actinic keratosis: Secondary | ICD-10-CM | POA: Diagnosis not present

## 2016-07-18 DIAGNOSIS — R7989 Other specified abnormal findings of blood chemistry: Secondary | ICD-10-CM | POA: Diagnosis not present

## 2016-07-18 DIAGNOSIS — B354 Tinea corporis: Secondary | ICD-10-CM | POA: Diagnosis not present

## 2016-07-18 DIAGNOSIS — M1991 Primary osteoarthritis, unspecified site: Secondary | ICD-10-CM | POA: Diagnosis not present

## 2016-07-18 DIAGNOSIS — Z1389 Encounter for screening for other disorder: Secondary | ICD-10-CM | POA: Diagnosis not present

## 2016-07-18 DIAGNOSIS — L821 Other seborrheic keratosis: Secondary | ICD-10-CM | POA: Diagnosis not present

## 2016-07-18 DIAGNOSIS — G894 Chronic pain syndrome: Secondary | ICD-10-CM | POA: Diagnosis not present

## 2016-07-18 DIAGNOSIS — Z6827 Body mass index (BMI) 27.0-27.9, adult: Secondary | ICD-10-CM | POA: Diagnosis not present

## 2016-07-18 DIAGNOSIS — E782 Mixed hyperlipidemia: Secondary | ICD-10-CM | POA: Diagnosis not present

## 2016-07-18 DIAGNOSIS — Z23 Encounter for immunization: Secondary | ICD-10-CM | POA: Diagnosis not present

## 2016-08-29 DIAGNOSIS — N289 Disorder of kidney and ureter, unspecified: Secondary | ICD-10-CM | POA: Diagnosis not present

## 2016-08-29 DIAGNOSIS — E7800 Pure hypercholesterolemia, unspecified: Secondary | ICD-10-CM | POA: Diagnosis not present

## 2016-08-29 DIAGNOSIS — Z0000 Encounter for general adult medical examination without abnormal findings: Secondary | ICD-10-CM | POA: Diagnosis not present

## 2016-08-29 DIAGNOSIS — I10 Essential (primary) hypertension: Secondary | ICD-10-CM | POA: Diagnosis not present

## 2016-08-29 DIAGNOSIS — G8929 Other chronic pain: Secondary | ICD-10-CM | POA: Diagnosis not present

## 2016-08-29 DIAGNOSIS — Z125 Encounter for screening for malignant neoplasm of prostate: Secondary | ICD-10-CM | POA: Diagnosis not present

## 2016-08-29 DIAGNOSIS — M25561 Pain in right knee: Secondary | ICD-10-CM | POA: Diagnosis not present

## 2016-08-29 DIAGNOSIS — D649 Anemia, unspecified: Secondary | ICD-10-CM | POA: Diagnosis not present

## 2016-10-25 IMAGING — CT CT ABD-PELV W/O CM
2 of 4 series · 15 of 46 positions shown, 17 images · non-contrast
Comparison: Aortic ultrasound performed 11/23/2013

CLINICAL DATA: Acute onset of left lower quadrant abdominal pain
and constipation. Microhematuria. Initial encounter.

EXAM:
CT ABDOMEN AND PELVIS WITHOUT CONTRAST
TECHNIQUE: Multidetector CT imaging of the abdomen and pelvis was performed
following the standard protocol without IV contrast.

[Series 2: abd/pel w/o · axial · non-contrast · 0.76mm/px · z∈[-444,-30]mm · 12 of 91 slices shown, 14 images]
[im 4/91  soft-tissue]
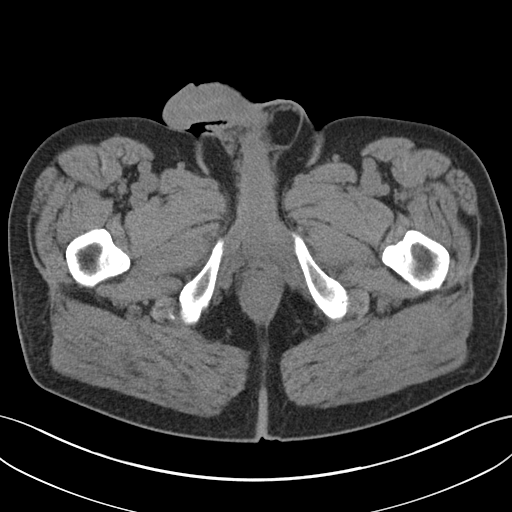
[im 4/91  bone]
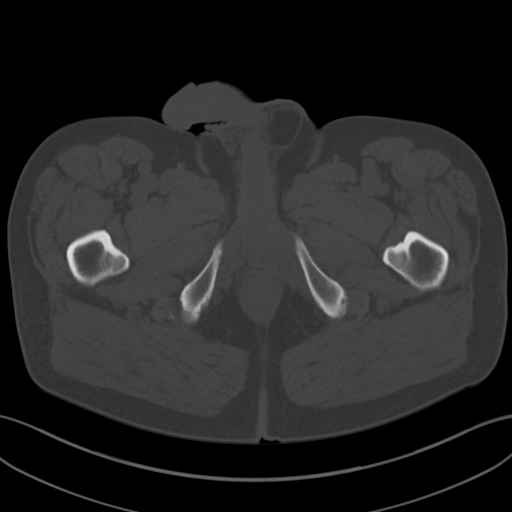
[im 12/91  soft-tissue]
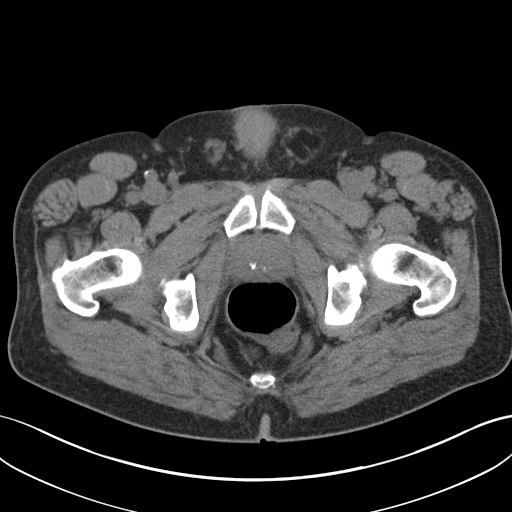
[im 19/91  soft-tissue]
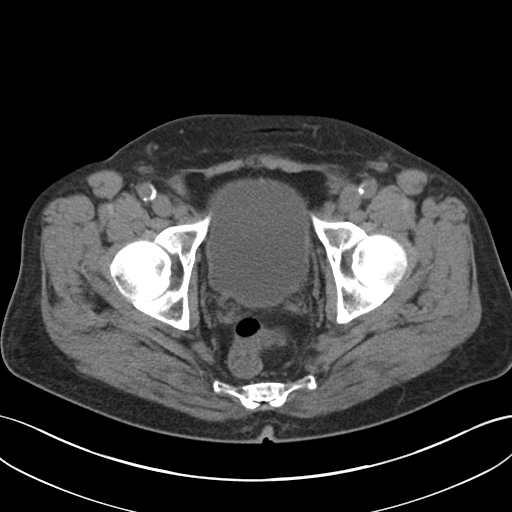
[im 27/91  soft-tissue]
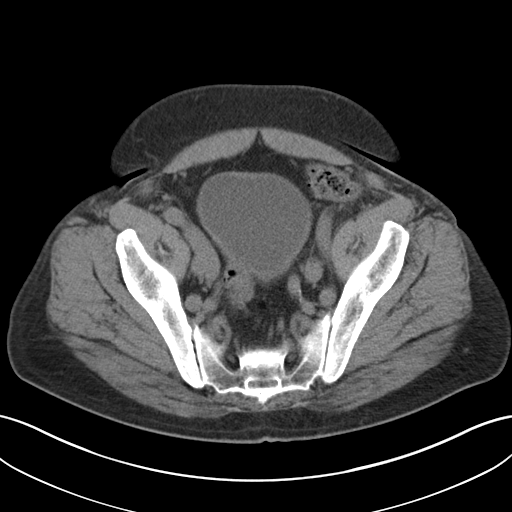
[im 34/91  soft-tissue]
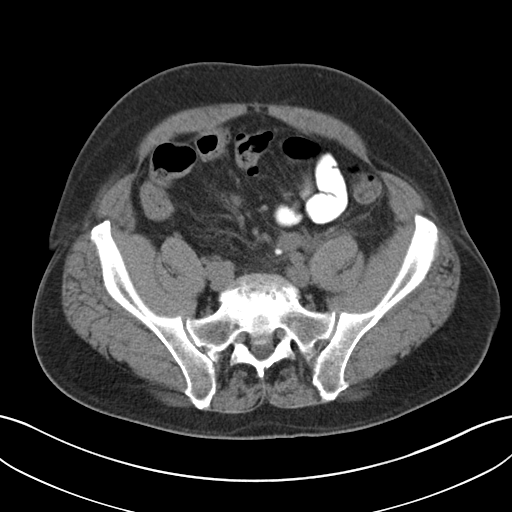
[im 42/91  soft-tissue]
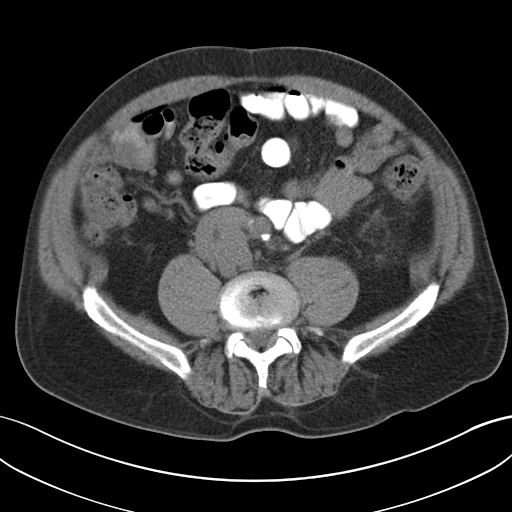
[im 49/91  soft-tissue]
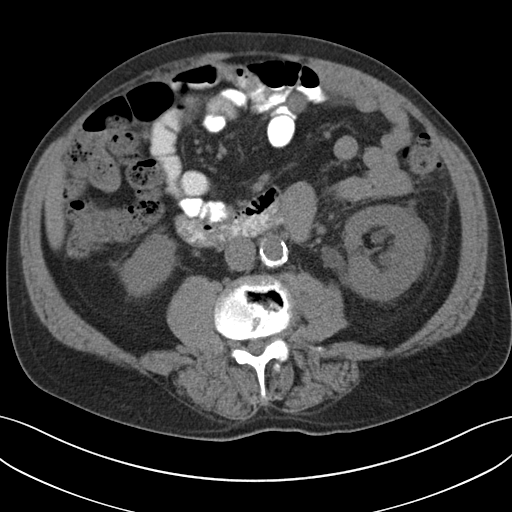
[im 57/91  soft-tissue]
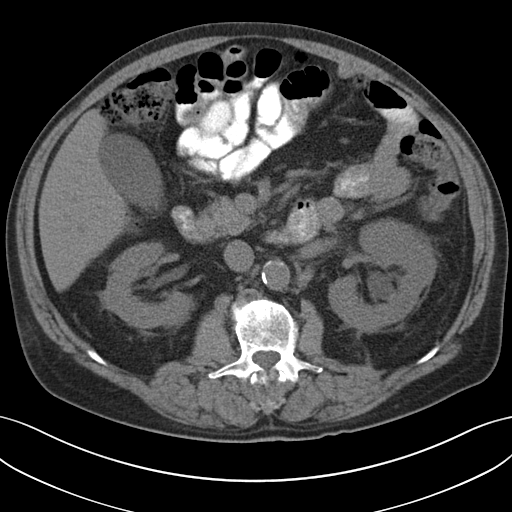
[im 64/91  soft-tissue]
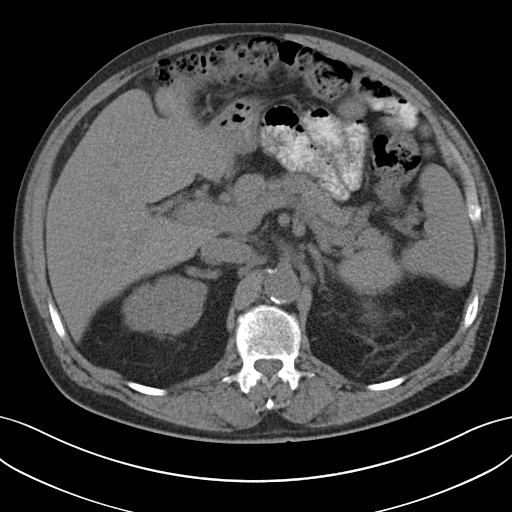
[im 64/91  bone]
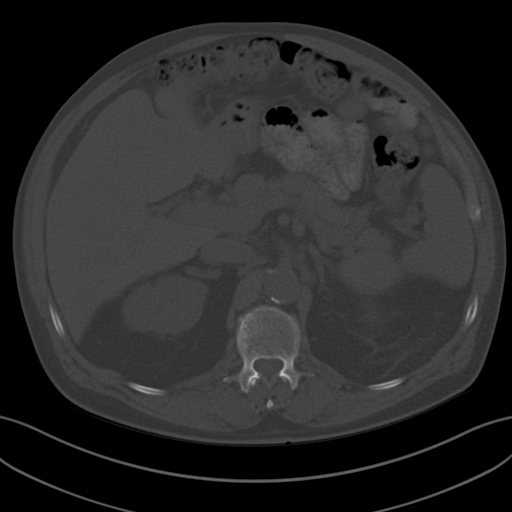
[im 72/91  soft-tissue]
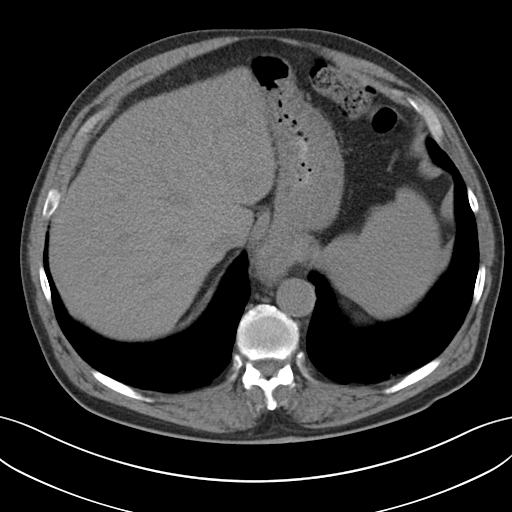
[im 79/91  soft-tissue]
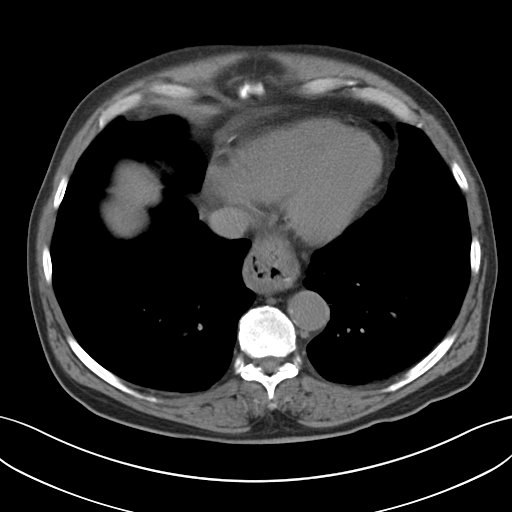
[im 87/91  soft-tissue]
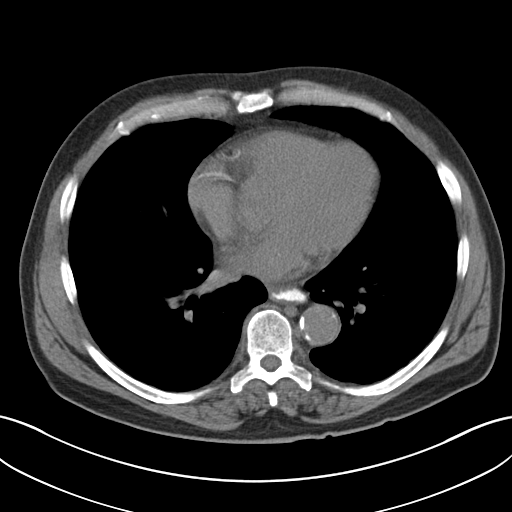

[Series 4: coronal · coronal · 0.74mm/px · 3 of 102 slices shown]
[im 34/102  soft-tissue]
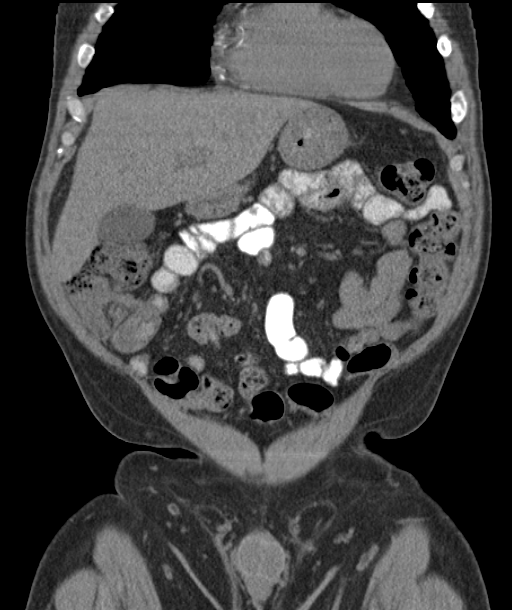
[im 45/102  soft-tissue]
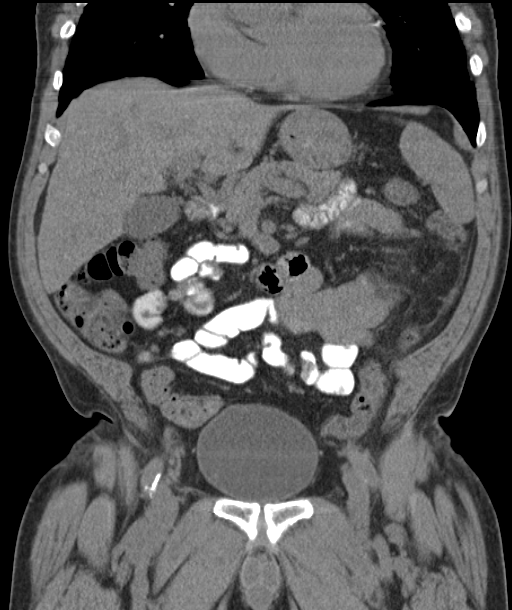
[im 57/102  soft-tissue]
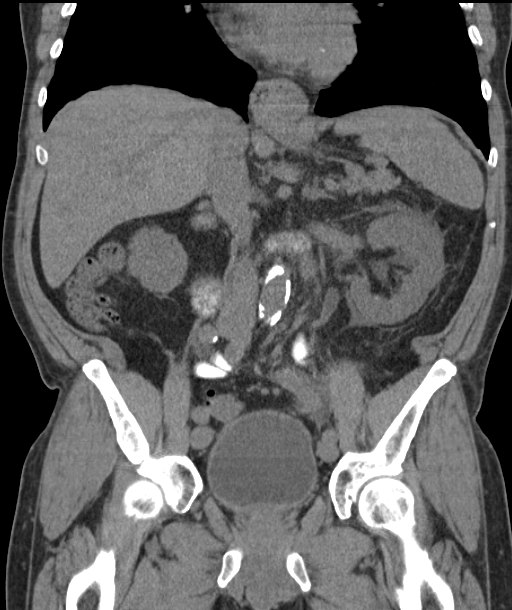

[15 of 46 positions shown; findings below may reference images not displayed]

FINDINGS: The visualized lung bases are clear. A small to moderate hiatal
hernia is seen. Diffuse coronary artery calcifications are seen.

The liver and spleen are unremarkable in appearance. The gallbladder
is within normal limits. The pancreas and adrenal glands are
unremarkable.

There is mild-to-moderate left-sided hydronephrosis, with asymmetric
left-sided perinephric stranding and fluid. This reflects an
obstructing 5 x 3 mm stone in the proximal left ureter, just below
the left renal pelvis. Scattered nonobstructing bilateral renal
stones are seen, measuring up to 5 mm in size. Mild nonspecific
right-sided perinephric stranding is noted.

No free fluid is identified. The small bowel is unremarkable in
appearance. The stomach is within normal limits. No acute vascular
abnormalities are seen. Scattered calcification is seen along the
abdominal aorta and its branches.

The appendix is normal in caliber and contains trace air, without
evidence of appendicitis. Scattered diverticulosis is noted along
the sigmoid colon, without evidence of diverticulitis. There is a
relatively small amount of stool in the colon, without evidence of
constipation.

The bladder is mildly distended grossly unremarkable. The prostate
remains borderline normal in size, with scattered calcification. A
small left inguinal hernia is seen, containing only fat. The patient
is status post vasectomy. No inguinal lymphadenopathy is seen.

No acute osseous abnormalities are identified. Multilevel vacuum
phenomenon is noted along the lumbar spine, with scattered endplate
sclerotic change and multilevel disc space narrowing.
IMPRESSION: 1. Mild-to-moderate left-sided hydronephrosis, with an obstructing 5
x 3 mm stone in the proximal left ureter, just below the left renal
pelvis.
2. Scattered nonobstructing bilateral renal stones, measuring up to
5 mm in size.
3. Small to moderate hiatal hernia seen.
4. Diffuse coronary artery calcifications noted.
5. Scattered calcification along the abdominal aorta and its
branches.
6. Scattered diverticulosis along the sigmoid colon, without
evidence of diverticulitis.
7. Small left inguinal hernia, containing only fat.
8. Mild diffuse degenerative change along the lumbar spine.

## 2016-12-05 DIAGNOSIS — Z6827 Body mass index (BMI) 27.0-27.9, adult: Secondary | ICD-10-CM | POA: Diagnosis not present

## 2016-12-05 DIAGNOSIS — G894 Chronic pain syndrome: Secondary | ICD-10-CM | POA: Diagnosis not present

## 2016-12-05 DIAGNOSIS — Z23 Encounter for immunization: Secondary | ICD-10-CM | POA: Diagnosis not present

## 2016-12-05 DIAGNOSIS — E663 Overweight: Secondary | ICD-10-CM | POA: Diagnosis not present

## 2016-12-13 DIAGNOSIS — I10 Essential (primary) hypertension: Secondary | ICD-10-CM | POA: Diagnosis not present

## 2016-12-13 DIAGNOSIS — N183 Chronic kidney disease, stage 3 (moderate): Secondary | ICD-10-CM | POA: Diagnosis not present

## 2016-12-14 DIAGNOSIS — Z862 Personal history of diseases of the blood and blood-forming organs and certain disorders involving the immune mechanism: Secondary | ICD-10-CM | POA: Diagnosis not present

## 2016-12-18 DIAGNOSIS — R05 Cough: Secondary | ICD-10-CM | POA: Diagnosis not present

## 2017-01-22 DIAGNOSIS — Z96651 Presence of right artificial knee joint: Secondary | ICD-10-CM | POA: Diagnosis not present

## 2017-01-22 DIAGNOSIS — M7631 Iliotibial band syndrome, right leg: Secondary | ICD-10-CM | POA: Diagnosis not present

## 2017-01-29 DIAGNOSIS — M25561 Pain in right knee: Secondary | ICD-10-CM | POA: Diagnosis not present

## 2017-02-04 DIAGNOSIS — M25561 Pain in right knee: Secondary | ICD-10-CM | POA: Diagnosis not present

## 2017-02-06 DIAGNOSIS — M25561 Pain in right knee: Secondary | ICD-10-CM | POA: Diagnosis not present

## 2017-02-08 DIAGNOSIS — M25561 Pain in right knee: Secondary | ICD-10-CM | POA: Diagnosis not present

## 2017-02-11 DIAGNOSIS — M25561 Pain in right knee: Secondary | ICD-10-CM | POA: Diagnosis not present

## 2017-02-28 DIAGNOSIS — I129 Hypertensive chronic kidney disease with stage 1 through stage 4 chronic kidney disease, or unspecified chronic kidney disease: Secondary | ICD-10-CM | POA: Diagnosis not present

## 2017-02-28 DIAGNOSIS — N400 Enlarged prostate without lower urinary tract symptoms: Secondary | ICD-10-CM | POA: Diagnosis not present

## 2017-02-28 DIAGNOSIS — Z87891 Personal history of nicotine dependence: Secondary | ICD-10-CM | POA: Diagnosis not present

## 2017-02-28 DIAGNOSIS — I10 Essential (primary) hypertension: Secondary | ICD-10-CM | POA: Diagnosis not present

## 2017-02-28 DIAGNOSIS — N200 Calculus of kidney: Secondary | ICD-10-CM | POA: Diagnosis not present

## 2017-02-28 DIAGNOSIS — N183 Chronic kidney disease, stage 3 (moderate): Secondary | ICD-10-CM | POA: Diagnosis not present

## 2017-02-28 DIAGNOSIS — G6289 Other specified polyneuropathies: Secondary | ICD-10-CM | POA: Diagnosis not present

## 2017-03-07 DIAGNOSIS — M9902 Segmental and somatic dysfunction of thoracic region: Secondary | ICD-10-CM | POA: Diagnosis not present

## 2017-03-07 DIAGNOSIS — M9901 Segmental and somatic dysfunction of cervical region: Secondary | ICD-10-CM | POA: Diagnosis not present

## 2017-03-07 DIAGNOSIS — M7541 Impingement syndrome of right shoulder: Secondary | ICD-10-CM | POA: Diagnosis not present

## 2017-03-07 DIAGNOSIS — M7521 Bicipital tendinitis, right shoulder: Secondary | ICD-10-CM | POA: Diagnosis not present

## 2017-03-08 DIAGNOSIS — M7541 Impingement syndrome of right shoulder: Secondary | ICD-10-CM | POA: Diagnosis not present

## 2017-03-08 DIAGNOSIS — M7521 Bicipital tendinitis, right shoulder: Secondary | ICD-10-CM | POA: Diagnosis not present

## 2017-03-08 DIAGNOSIS — M9901 Segmental and somatic dysfunction of cervical region: Secondary | ICD-10-CM | POA: Diagnosis not present

## 2017-03-08 DIAGNOSIS — M9902 Segmental and somatic dysfunction of thoracic region: Secondary | ICD-10-CM | POA: Diagnosis not present

## 2017-03-26 DIAGNOSIS — Z96651 Presence of right artificial knee joint: Secondary | ICD-10-CM | POA: Diagnosis not present

## 2017-03-26 DIAGNOSIS — Z719 Counseling, unspecified: Secondary | ICD-10-CM | POA: Diagnosis not present

## 2017-03-26 DIAGNOSIS — R05 Cough: Secondary | ICD-10-CM | POA: Diagnosis not present

## 2017-03-26 DIAGNOSIS — M179 Osteoarthritis of knee, unspecified: Secondary | ICD-10-CM | POA: Diagnosis not present

## 2017-03-26 DIAGNOSIS — M25569 Pain in unspecified knee: Secondary | ICD-10-CM | POA: Diagnosis not present

## 2017-03-27 DIAGNOSIS — M25561 Pain in right knee: Secondary | ICD-10-CM | POA: Diagnosis not present

## 2017-03-27 DIAGNOSIS — M25562 Pain in left knee: Secondary | ICD-10-CM | POA: Diagnosis not present

## 2017-03-30 DIAGNOSIS — M9904 Segmental and somatic dysfunction of sacral region: Secondary | ICD-10-CM | POA: Diagnosis not present

## 2017-03-30 DIAGNOSIS — M9903 Segmental and somatic dysfunction of lumbar region: Secondary | ICD-10-CM | POA: Diagnosis not present

## 2017-03-30 DIAGNOSIS — M5417 Radiculopathy, lumbosacral region: Secondary | ICD-10-CM | POA: Diagnosis not present

## 2017-03-30 DIAGNOSIS — M9902 Segmental and somatic dysfunction of thoracic region: Secondary | ICD-10-CM | POA: Diagnosis not present

## 2017-04-01 DIAGNOSIS — I10 Essential (primary) hypertension: Secondary | ICD-10-CM | POA: Diagnosis not present

## 2017-04-01 DIAGNOSIS — B029 Zoster without complications: Secondary | ICD-10-CM | POA: Diagnosis not present

## 2017-04-01 DIAGNOSIS — M25561 Pain in right knee: Secondary | ICD-10-CM | POA: Diagnosis not present

## 2017-04-01 DIAGNOSIS — Z719 Counseling, unspecified: Secondary | ICD-10-CM | POA: Diagnosis not present

## 2017-04-01 DIAGNOSIS — M25462 Effusion, left knee: Secondary | ICD-10-CM | POA: Diagnosis not present

## 2017-04-01 DIAGNOSIS — M25562 Pain in left knee: Secondary | ICD-10-CM | POA: Diagnosis not present

## 2017-04-02 DIAGNOSIS — M9903 Segmental and somatic dysfunction of lumbar region: Secondary | ICD-10-CM | POA: Diagnosis not present

## 2017-04-02 DIAGNOSIS — M5417 Radiculopathy, lumbosacral region: Secondary | ICD-10-CM | POA: Diagnosis not present

## 2017-04-02 DIAGNOSIS — M9902 Segmental and somatic dysfunction of thoracic region: Secondary | ICD-10-CM | POA: Diagnosis not present

## 2017-04-02 DIAGNOSIS — M9904 Segmental and somatic dysfunction of sacral region: Secondary | ICD-10-CM | POA: Diagnosis not present

## 2017-04-03 DIAGNOSIS — M9904 Segmental and somatic dysfunction of sacral region: Secondary | ICD-10-CM | POA: Diagnosis not present

## 2017-04-03 DIAGNOSIS — M9902 Segmental and somatic dysfunction of thoracic region: Secondary | ICD-10-CM | POA: Diagnosis not present

## 2017-04-03 DIAGNOSIS — M9903 Segmental and somatic dysfunction of lumbar region: Secondary | ICD-10-CM | POA: Diagnosis not present

## 2017-04-03 DIAGNOSIS — M5417 Radiculopathy, lumbosacral region: Secondary | ICD-10-CM | POA: Diagnosis not present

## 2017-04-04 DIAGNOSIS — M9904 Segmental and somatic dysfunction of sacral region: Secondary | ICD-10-CM | POA: Diagnosis not present

## 2017-04-04 DIAGNOSIS — M9902 Segmental and somatic dysfunction of thoracic region: Secondary | ICD-10-CM | POA: Diagnosis not present

## 2017-04-04 DIAGNOSIS — M9903 Segmental and somatic dysfunction of lumbar region: Secondary | ICD-10-CM | POA: Diagnosis not present

## 2017-04-04 DIAGNOSIS — M5417 Radiculopathy, lumbosacral region: Secondary | ICD-10-CM | POA: Diagnosis not present

## 2017-07-11 DIAGNOSIS — I129 Hypertensive chronic kidney disease with stage 1 through stage 4 chronic kidney disease, or unspecified chronic kidney disease: Secondary | ICD-10-CM | POA: Diagnosis not present

## 2017-07-11 DIAGNOSIS — N183 Chronic kidney disease, stage 3 (moderate): Secondary | ICD-10-CM | POA: Diagnosis not present

## 2017-07-11 DIAGNOSIS — G629 Polyneuropathy, unspecified: Secondary | ICD-10-CM | POA: Diagnosis not present

## 2017-07-11 DIAGNOSIS — I10 Essential (primary) hypertension: Secondary | ICD-10-CM | POA: Diagnosis not present

## 2017-07-11 DIAGNOSIS — Z87891 Personal history of nicotine dependence: Secondary | ICD-10-CM | POA: Diagnosis not present

## 2017-07-15 DIAGNOSIS — H2513 Age-related nuclear cataract, bilateral: Secondary | ICD-10-CM | POA: Diagnosis not present

## 2017-07-17 DIAGNOSIS — D1801 Hemangioma of skin and subcutaneous tissue: Secondary | ICD-10-CM | POA: Diagnosis not present

## 2017-07-17 DIAGNOSIS — L821 Other seborrheic keratosis: Secondary | ICD-10-CM | POA: Diagnosis not present

## 2017-07-17 DIAGNOSIS — H61021 Chronic perichondritis of right external ear: Secondary | ICD-10-CM | POA: Diagnosis not present

## 2017-07-17 DIAGNOSIS — D2271 Melanocytic nevi of right lower limb, including hip: Secondary | ICD-10-CM | POA: Diagnosis not present

## 2017-07-17 DIAGNOSIS — L570 Actinic keratosis: Secondary | ICD-10-CM | POA: Diagnosis not present

## 2017-07-17 DIAGNOSIS — L814 Other melanin hyperpigmentation: Secondary | ICD-10-CM | POA: Diagnosis not present

## 2017-07-17 DIAGNOSIS — D225 Melanocytic nevi of trunk: Secondary | ICD-10-CM | POA: Diagnosis not present

## 2017-07-17 DIAGNOSIS — L720 Epidermal cyst: Secondary | ICD-10-CM | POA: Diagnosis not present

## 2017-07-22 DIAGNOSIS — H01002 Unspecified blepharitis right lower eyelid: Secondary | ICD-10-CM | POA: Diagnosis not present

## 2017-07-22 DIAGNOSIS — H04123 Dry eye syndrome of bilateral lacrimal glands: Secondary | ICD-10-CM | POA: Diagnosis not present

## 2017-07-22 DIAGNOSIS — H01004 Unspecified blepharitis left upper eyelid: Secondary | ICD-10-CM | POA: Diagnosis not present

## 2017-07-22 DIAGNOSIS — H01001 Unspecified blepharitis right upper eyelid: Secondary | ICD-10-CM | POA: Diagnosis not present

## 2017-07-29 DIAGNOSIS — R7309 Other abnormal glucose: Secondary | ICD-10-CM | POA: Diagnosis not present

## 2017-07-29 DIAGNOSIS — Z0001 Encounter for general adult medical examination with abnormal findings: Secondary | ICD-10-CM | POA: Diagnosis not present

## 2017-07-29 DIAGNOSIS — Z6828 Body mass index (BMI) 28.0-28.9, adult: Secondary | ICD-10-CM | POA: Diagnosis not present

## 2017-07-29 DIAGNOSIS — G894 Chronic pain syndrome: Secondary | ICD-10-CM | POA: Diagnosis not present

## 2017-07-29 DIAGNOSIS — R7989 Other specified abnormal findings of blood chemistry: Secondary | ICD-10-CM | POA: Diagnosis not present

## 2017-07-29 DIAGNOSIS — E782 Mixed hyperlipidemia: Secondary | ICD-10-CM | POA: Diagnosis not present

## 2017-07-29 DIAGNOSIS — N400 Enlarged prostate without lower urinary tract symptoms: Secondary | ICD-10-CM | POA: Diagnosis not present

## 2017-07-29 DIAGNOSIS — I10 Essential (primary) hypertension: Secondary | ICD-10-CM | POA: Diagnosis not present

## 2017-07-29 DIAGNOSIS — M1991 Primary osteoarthritis, unspecified site: Secondary | ICD-10-CM | POA: Diagnosis not present

## 2018-01-09 DIAGNOSIS — I10 Essential (primary) hypertension: Secondary | ICD-10-CM | POA: Diagnosis not present

## 2018-01-09 DIAGNOSIS — E663 Overweight: Secondary | ICD-10-CM | POA: Diagnosis not present

## 2018-01-09 DIAGNOSIS — Z6826 Body mass index (BMI) 26.0-26.9, adult: Secondary | ICD-10-CM | POA: Diagnosis not present

## 2018-01-09 DIAGNOSIS — Z23 Encounter for immunization: Secondary | ICD-10-CM | POA: Diagnosis not present

## 2018-01-09 DIAGNOSIS — Z1389 Encounter for screening for other disorder: Secondary | ICD-10-CM | POA: Diagnosis not present

## 2018-01-15 DIAGNOSIS — E663 Overweight: Secondary | ICD-10-CM | POA: Diagnosis not present

## 2018-01-15 DIAGNOSIS — I10 Essential (primary) hypertension: Secondary | ICD-10-CM | POA: Diagnosis not present

## 2018-01-15 DIAGNOSIS — Z6826 Body mass index (BMI) 26.0-26.9, adult: Secondary | ICD-10-CM | POA: Diagnosis not present

## 2018-01-23 DIAGNOSIS — S62340A Nondisplaced fracture of base of second metacarpal bone, right hand, initial encounter for closed fracture: Secondary | ICD-10-CM | POA: Diagnosis not present

## 2018-02-03 DIAGNOSIS — Z125 Encounter for screening for malignant neoplasm of prostate: Secondary | ICD-10-CM | POA: Diagnosis not present

## 2018-02-03 DIAGNOSIS — G629 Polyneuropathy, unspecified: Secondary | ICD-10-CM | POA: Diagnosis not present

## 2018-02-03 DIAGNOSIS — I351 Nonrheumatic aortic (valve) insufficiency: Secondary | ICD-10-CM | POA: Diagnosis not present

## 2018-02-03 DIAGNOSIS — E7800 Pure hypercholesterolemia, unspecified: Secondary | ICD-10-CM | POA: Diagnosis not present

## 2018-02-03 DIAGNOSIS — K219 Gastro-esophageal reflux disease without esophagitis: Secondary | ICD-10-CM | POA: Diagnosis not present

## 2018-02-03 DIAGNOSIS — I10 Essential (primary) hypertension: Secondary | ICD-10-CM | POA: Diagnosis not present

## 2018-02-03 DIAGNOSIS — Z0000 Encounter for general adult medical examination without abnormal findings: Secondary | ICD-10-CM | POA: Diagnosis not present

## 2018-02-03 DIAGNOSIS — D649 Anemia, unspecified: Secondary | ICD-10-CM | POA: Diagnosis not present

## 2018-02-03 DIAGNOSIS — I7789 Other specified disorders of arteries and arterioles: Secondary | ICD-10-CM | POA: Diagnosis not present

## 2018-02-03 DIAGNOSIS — E663 Overweight: Secondary | ICD-10-CM | POA: Diagnosis not present

## 2018-04-08 DIAGNOSIS — G4733 Obstructive sleep apnea (adult) (pediatric): Secondary | ICD-10-CM | POA: Diagnosis not present

## 2018-04-08 DIAGNOSIS — R0989 Other specified symptoms and signs involving the circulatory and respiratory systems: Secondary | ICD-10-CM | POA: Diagnosis not present

## 2018-07-01 DIAGNOSIS — G894 Chronic pain syndrome: Secondary | ICD-10-CM | POA: Diagnosis not present

## 2018-07-01 DIAGNOSIS — M1991 Primary osteoarthritis, unspecified site: Secondary | ICD-10-CM | POA: Diagnosis not present

## 2018-07-18 DIAGNOSIS — G629 Polyneuropathy, unspecified: Secondary | ICD-10-CM | POA: Diagnosis not present

## 2018-07-25 DIAGNOSIS — M25531 Pain in right wrist: Secondary | ICD-10-CM | POA: Diagnosis not present

## 2018-07-25 DIAGNOSIS — M1811 Unilateral primary osteoarthritis of first carpometacarpal joint, right hand: Secondary | ICD-10-CM | POA: Diagnosis not present

## 2018-08-26 DIAGNOSIS — M25561 Pain in right knee: Secondary | ICD-10-CM | POA: Diagnosis not present

## 2018-09-15 DIAGNOSIS — Z96651 Presence of right artificial knee joint: Secondary | ICD-10-CM | POA: Diagnosis not present

## 2018-09-15 DIAGNOSIS — M25561 Pain in right knee: Secondary | ICD-10-CM | POA: Diagnosis not present

## 2018-10-07 DIAGNOSIS — J029 Acute pharyngitis, unspecified: Secondary | ICD-10-CM | POA: Diagnosis not present

## 2018-10-07 DIAGNOSIS — Z20828 Contact with and (suspected) exposure to other viral communicable diseases: Secondary | ICD-10-CM | POA: Diagnosis not present

## 2018-10-07 DIAGNOSIS — Z1159 Encounter for screening for other viral diseases: Secondary | ICD-10-CM | POA: Diagnosis not present

## 2018-10-15 DIAGNOSIS — Z0000 Encounter for general adult medical examination without abnormal findings: Secondary | ICD-10-CM | POA: Diagnosis not present

## 2018-10-15 DIAGNOSIS — Z681 Body mass index (BMI) 19 or less, adult: Secondary | ICD-10-CM | POA: Diagnosis not present

## 2018-10-15 DIAGNOSIS — I10 Essential (primary) hypertension: Secondary | ICD-10-CM | POA: Diagnosis not present

## 2018-10-15 DIAGNOSIS — G894 Chronic pain syndrome: Secondary | ICD-10-CM | POA: Diagnosis not present

## 2018-10-15 DIAGNOSIS — E785 Hyperlipidemia, unspecified: Secondary | ICD-10-CM | POA: Diagnosis not present

## 2018-10-15 DIAGNOSIS — R7989 Other specified abnormal findings of blood chemistry: Secondary | ICD-10-CM | POA: Diagnosis not present

## 2018-10-15 DIAGNOSIS — Z1389 Encounter for screening for other disorder: Secondary | ICD-10-CM | POA: Diagnosis not present

## 2018-10-15 DIAGNOSIS — Z23 Encounter for immunization: Secondary | ICD-10-CM | POA: Diagnosis not present

## 2018-11-05 DIAGNOSIS — M7541 Impingement syndrome of right shoulder: Secondary | ICD-10-CM | POA: Diagnosis not present

## 2018-11-05 DIAGNOSIS — M25511 Pain in right shoulder: Secondary | ICD-10-CM | POA: Diagnosis not present

## 2019-01-21 DIAGNOSIS — M25562 Pain in left knee: Secondary | ICD-10-CM | POA: Diagnosis not present

## 2019-01-21 DIAGNOSIS — M1712 Unilateral primary osteoarthritis, left knee: Secondary | ICD-10-CM | POA: Diagnosis not present

## 2019-01-28 DIAGNOSIS — Z23 Encounter for immunization: Secondary | ICD-10-CM | POA: Diagnosis not present

## 2019-03-24 DIAGNOSIS — H43813 Vitreous degeneration, bilateral: Secondary | ICD-10-CM | POA: Diagnosis not present

## 2019-03-24 DIAGNOSIS — H2513 Age-related nuclear cataract, bilateral: Secondary | ICD-10-CM | POA: Diagnosis not present

## 2019-03-24 DIAGNOSIS — H5203 Hypermetropia, bilateral: Secondary | ICD-10-CM | POA: Diagnosis not present

## 2019-03-24 DIAGNOSIS — H52223 Regular astigmatism, bilateral: Secondary | ICD-10-CM | POA: Diagnosis not present

## 2019-03-25 DIAGNOSIS — M1811 Unilateral primary osteoarthritis of first carpometacarpal joint, right hand: Secondary | ICD-10-CM | POA: Diagnosis not present

## 2019-05-10 DIAGNOSIS — E7849 Other hyperlipidemia: Secondary | ICD-10-CM | POA: Diagnosis not present

## 2019-05-10 DIAGNOSIS — E291 Testicular hypofunction: Secondary | ICD-10-CM | POA: Diagnosis not present

## 2019-05-10 DIAGNOSIS — M1991 Primary osteoarthritis, unspecified site: Secondary | ICD-10-CM | POA: Diagnosis not present

## 2019-05-10 DIAGNOSIS — I10 Essential (primary) hypertension: Secondary | ICD-10-CM | POA: Diagnosis not present

## 2019-06-10 DIAGNOSIS — I10 Essential (primary) hypertension: Secondary | ICD-10-CM | POA: Diagnosis not present

## 2019-06-10 DIAGNOSIS — M1991 Primary osteoarthritis, unspecified site: Secondary | ICD-10-CM | POA: Diagnosis not present

## 2019-06-10 DIAGNOSIS — E291 Testicular hypofunction: Secondary | ICD-10-CM | POA: Diagnosis not present

## 2019-06-10 DIAGNOSIS — E7849 Other hyperlipidemia: Secondary | ICD-10-CM | POA: Diagnosis not present

## 2024-03-19 ENCOUNTER — Other Ambulatory Visit: Payer: Self-pay | Admitting: Orthopedic Surgery

## 2024-03-19 DIAGNOSIS — M545 Low back pain, unspecified: Secondary | ICD-10-CM

## 2024-03-24 NOTE — Discharge Instructions (Signed)

## 2024-03-25 ENCOUNTER — Inpatient Hospital Stay
Admission: RE | Admit: 2024-03-25 | Discharge: 2024-03-25 | Attending: Orthopedic Surgery | Admitting: Orthopedic Surgery

## 2024-03-25 DIAGNOSIS — M545 Low back pain, unspecified: Secondary | ICD-10-CM

## 2024-03-25 MED ORDER — DIAZEPAM 5 MG PO TABS
5.0000 mg | ORAL_TABLET | Freq: Once | ORAL | Status: AC
  Administered 2024-03-25: 5 mg via ORAL

## 2024-03-25 MED ORDER — ONDANSETRON HCL 4 MG/2ML IJ SOLN: 4.0000 mg | Freq: Once | INTRAMUSCULAR | Status: DC | PRN

## 2024-03-25 MED ORDER — IOPAMIDOL (ISOVUE-M 200) INJECTION 41%
20.0000 mL | Freq: Once | INTRAMUSCULAR | Status: AC
  Administered 2024-03-25: 20 mL via INTRATHECAL

## 2024-03-25 MED ORDER — MEPERIDINE HCL 50 MG/ML IJ SOLN: 50.0000 mg | Freq: Once | INTRAMUSCULAR | Status: DC | PRN
